# Patient Record
Sex: Female | Born: 1980 | Race: White | Hispanic: No | Marital: Single | Smoking: Current every day smoker
Health system: Southern US, Community
[De-identification: ages and names within clinical notes are randomized; demographics above are authoritative.]

## PROBLEM LIST (undated history)

## (undated) DIAGNOSIS — R319 Hematuria, unspecified: Secondary | ICD-10-CM

## (undated) DIAGNOSIS — Z87442 Personal history of urinary calculi: Secondary | ICD-10-CM

## (undated) DIAGNOSIS — N200 Calculus of kidney: Secondary | ICD-10-CM

## (undated) DIAGNOSIS — M51369 Other intervertebral disc degeneration, lumbar region without mention of lumbar back pain or lower extremity pain: Secondary | ICD-10-CM

## (undated) DIAGNOSIS — N201 Calculus of ureter: Secondary | ICD-10-CM

## (undated) DIAGNOSIS — M47816 Spondylosis without myelopathy or radiculopathy, lumbar region: Secondary | ICD-10-CM

## (undated) DIAGNOSIS — M549 Dorsalgia, unspecified: Secondary | ICD-10-CM

## (undated) DIAGNOSIS — G8929 Other chronic pain: Secondary | ICD-10-CM

## (undated) DIAGNOSIS — M5136 Other intervertebral disc degeneration, lumbar region: Secondary | ICD-10-CM

## (undated) HISTORY — PX: WISDOM TOOTH EXTRACTION: SHX21

---

## 2000-02-06 ENCOUNTER — Encounter: Payer: Self-pay | Admitting: *Deleted

## 2000-02-06 ENCOUNTER — Ambulatory Visit (HOSPITAL_COMMUNITY): Admission: RE | Admit: 2000-02-06 | Discharge: 2000-02-06 | Payer: Self-pay | Admitting: *Deleted

## 2000-07-23 ENCOUNTER — Inpatient Hospital Stay (HOSPITAL_COMMUNITY): Admission: AD | Admit: 2000-07-23 | Discharge: 2000-07-25 | Payer: Self-pay | Admitting: *Deleted

## 2000-09-03 ENCOUNTER — Inpatient Hospital Stay (HOSPITAL_COMMUNITY): Admission: AD | Admit: 2000-09-03 | Discharge: 2000-09-03 | Payer: Self-pay | Admitting: *Deleted

## 2000-12-03 ENCOUNTER — Inpatient Hospital Stay (HOSPITAL_COMMUNITY): Admission: AD | Admit: 2000-12-03 | Discharge: 2000-12-03 | Payer: Self-pay | Admitting: *Deleted

## 2001-02-25 ENCOUNTER — Inpatient Hospital Stay (HOSPITAL_COMMUNITY): Admission: AD | Admit: 2001-02-25 | Discharge: 2001-02-25 | Payer: Self-pay | Admitting: *Deleted

## 2001-05-20 ENCOUNTER — Inpatient Hospital Stay (HOSPITAL_COMMUNITY): Admission: AD | Admit: 2001-05-20 | Discharge: 2001-05-20 | Payer: Self-pay | Admitting: *Deleted

## 2002-10-02 ENCOUNTER — Inpatient Hospital Stay (HOSPITAL_COMMUNITY): Admission: AD | Admit: 2002-10-02 | Discharge: 2002-10-02 | Payer: Self-pay | Admitting: Obstetrics

## 2002-10-04 ENCOUNTER — Inpatient Hospital Stay (HOSPITAL_COMMUNITY): Admission: AD | Admit: 2002-10-04 | Discharge: 2002-10-04 | Payer: Self-pay | Admitting: Obstetrics

## 2002-10-23 ENCOUNTER — Inpatient Hospital Stay (HOSPITAL_COMMUNITY): Admission: AD | Admit: 2002-10-23 | Discharge: 2002-10-23 | Payer: Self-pay | Admitting: Obstetrics

## 2002-11-07 ENCOUNTER — Inpatient Hospital Stay (HOSPITAL_COMMUNITY): Admission: AD | Admit: 2002-11-07 | Discharge: 2002-11-09 | Payer: Self-pay | Admitting: Obstetrics

## 2003-07-29 ENCOUNTER — Emergency Department (HOSPITAL_COMMUNITY): Admission: EM | Admit: 2003-07-29 | Discharge: 2003-07-30 | Payer: Self-pay | Admitting: Emergency Medicine

## 2003-08-18 ENCOUNTER — Emergency Department (HOSPITAL_COMMUNITY): Admission: EM | Admit: 2003-08-18 | Discharge: 2003-08-19 | Payer: Self-pay | Admitting: Emergency Medicine

## 2003-10-10 ENCOUNTER — Emergency Department (HOSPITAL_COMMUNITY): Admission: EM | Admit: 2003-10-10 | Discharge: 2003-10-10 | Payer: Self-pay | Admitting: Emergency Medicine

## 2004-01-18 ENCOUNTER — Emergency Department (HOSPITAL_COMMUNITY): Admission: EM | Admit: 2004-01-18 | Discharge: 2004-01-18 | Payer: Self-pay | Admitting: Emergency Medicine

## 2004-04-14 ENCOUNTER — Emergency Department (HOSPITAL_COMMUNITY): Admission: EM | Admit: 2004-04-14 | Discharge: 2004-04-14 | Payer: Self-pay | Admitting: Emergency Medicine

## 2004-04-15 ENCOUNTER — Emergency Department (HOSPITAL_COMMUNITY): Admission: EM | Admit: 2004-04-15 | Discharge: 2004-04-15 | Payer: Self-pay | Admitting: Family Medicine

## 2004-05-15 ENCOUNTER — Emergency Department (HOSPITAL_COMMUNITY): Admission: EM | Admit: 2004-05-15 | Discharge: 2004-05-15 | Payer: Self-pay | Admitting: Emergency Medicine

## 2004-05-18 ENCOUNTER — Emergency Department (HOSPITAL_COMMUNITY): Admission: EM | Admit: 2004-05-18 | Discharge: 2004-05-18 | Payer: Self-pay | Admitting: Emergency Medicine

## 2004-08-14 ENCOUNTER — Emergency Department (HOSPITAL_COMMUNITY): Admission: EM | Admit: 2004-08-14 | Discharge: 2004-08-14 | Payer: Self-pay | Admitting: Emergency Medicine

## 2004-08-16 ENCOUNTER — Emergency Department (HOSPITAL_COMMUNITY): Admission: EM | Admit: 2004-08-16 | Discharge: 2004-08-16 | Payer: Self-pay | Admitting: Emergency Medicine

## 2004-10-14 ENCOUNTER — Emergency Department (HOSPITAL_COMMUNITY): Admission: EM | Admit: 2004-10-14 | Discharge: 2004-10-14 | Payer: Self-pay | Admitting: Emergency Medicine

## 2005-08-23 ENCOUNTER — Emergency Department (HOSPITAL_COMMUNITY): Admission: EM | Admit: 2005-08-23 | Discharge: 2005-08-23 | Payer: Self-pay | Admitting: Emergency Medicine

## 2005-10-26 ENCOUNTER — Emergency Department (HOSPITAL_COMMUNITY): Admission: EM | Admit: 2005-10-26 | Discharge: 2005-10-26 | Payer: Self-pay | Admitting: Emergency Medicine

## 2006-02-16 ENCOUNTER — Emergency Department (HOSPITAL_COMMUNITY): Admission: EM | Admit: 2006-02-16 | Discharge: 2006-02-17 | Payer: Self-pay | Admitting: Emergency Medicine

## 2006-04-02 ENCOUNTER — Emergency Department (HOSPITAL_COMMUNITY): Admission: EM | Admit: 2006-04-02 | Discharge: 2006-04-02 | Payer: Self-pay | Admitting: Emergency Medicine

## 2006-05-06 ENCOUNTER — Emergency Department (HOSPITAL_COMMUNITY): Admission: EM | Admit: 2006-05-06 | Discharge: 2006-05-06 | Payer: Self-pay | Admitting: Emergency Medicine

## 2006-12-14 ENCOUNTER — Emergency Department (HOSPITAL_COMMUNITY): Admission: EM | Admit: 2006-12-14 | Discharge: 2006-12-14 | Payer: Self-pay | Admitting: Emergency Medicine

## 2007-11-05 ENCOUNTER — Emergency Department (HOSPITAL_COMMUNITY): Admission: EM | Admit: 2007-11-05 | Discharge: 2007-11-05 | Payer: Self-pay | Admitting: Emergency Medicine

## 2007-11-16 ENCOUNTER — Ambulatory Visit (HOSPITAL_COMMUNITY): Admission: RE | Admit: 2007-11-16 | Discharge: 2007-11-16 | Payer: Self-pay | Admitting: Orthopedic Surgery

## 2007-12-03 ENCOUNTER — Emergency Department (HOSPITAL_COMMUNITY): Admission: EM | Admit: 2007-12-03 | Discharge: 2007-12-03 | Payer: Self-pay | Admitting: Family Medicine

## 2008-02-20 ENCOUNTER — Emergency Department (HOSPITAL_COMMUNITY): Admission: EM | Admit: 2008-02-20 | Discharge: 2008-02-21 | Payer: Self-pay | Admitting: Emergency Medicine

## 2008-10-11 ENCOUNTER — Emergency Department (HOSPITAL_COMMUNITY): Admission: EM | Admit: 2008-10-11 | Discharge: 2008-10-11 | Payer: Self-pay | Admitting: Emergency Medicine

## 2010-01-14 ENCOUNTER — Emergency Department (HOSPITAL_COMMUNITY): Admission: EM | Admit: 2010-01-14 | Discharge: 2010-01-14 | Payer: Self-pay | Admitting: Emergency Medicine

## 2010-03-12 ENCOUNTER — Emergency Department (HOSPITAL_COMMUNITY): Admission: EM | Admit: 2010-03-12 | Discharge: 2010-03-12 | Payer: Self-pay | Admitting: Emergency Medicine

## 2010-03-28 ENCOUNTER — Emergency Department (HOSPITAL_COMMUNITY): Admission: EM | Admit: 2010-03-28 | Discharge: 2010-03-28 | Payer: Self-pay | Admitting: Emergency Medicine

## 2010-07-22 ENCOUNTER — Emergency Department (HOSPITAL_COMMUNITY): Admission: EM | Admit: 2010-07-22 | Discharge: 2010-07-22 | Payer: Self-pay | Admitting: Family Medicine

## 2010-09-13 ENCOUNTER — Emergency Department (HOSPITAL_COMMUNITY): Admission: EM | Admit: 2010-09-13 | Discharge: 2010-09-14 | Payer: Self-pay | Admitting: Emergency Medicine

## 2011-01-01 ENCOUNTER — Inpatient Hospital Stay (HOSPITAL_COMMUNITY)
Admission: AD | Admit: 2011-01-01 | Discharge: 2011-01-01 | Disposition: A | Discharge status: Home / Self Care | Payer: Self-pay | Source: Ambulatory Visit | Attending: Obstetrics and Gynecology | Admitting: Obstetrics and Gynecology

## 2011-01-01 ENCOUNTER — Inpatient Hospital Stay (HOSPITAL_COMMUNITY): Payer: Self-pay

## 2011-01-01 DIAGNOSIS — R1031 Right lower quadrant pain: Secondary | ICD-10-CM | POA: Insufficient documentation

## 2011-01-01 DIAGNOSIS — A499 Bacterial infection, unspecified: Secondary | ICD-10-CM | POA: Insufficient documentation

## 2011-01-01 DIAGNOSIS — N760 Acute vaginitis: Secondary | ICD-10-CM

## 2011-01-01 DIAGNOSIS — N83209 Unspecified ovarian cyst, unspecified side: Secondary | ICD-10-CM | POA: Insufficient documentation

## 2011-01-01 DIAGNOSIS — B9689 Other specified bacterial agents as the cause of diseases classified elsewhere: Secondary | ICD-10-CM | POA: Insufficient documentation

## 2011-01-01 LAB — URINALYSIS, ROUTINE W REFLEX MICROSCOPIC
Bilirubin Urine: NEGATIVE
Ketones, ur: NEGATIVE mg/dL
Protein, ur: NEGATIVE mg/dL
Urine Glucose, Fasting: NEGATIVE mg/dL
Urobilinogen, UA: 1 mg/dL (ref 0.0–1.0)

## 2011-01-01 LAB — WET PREP, GENITAL
Trich, Wet Prep: NONE SEEN
Yeast Wet Prep HPF POC: NONE SEEN

## 2011-01-01 LAB — URINE MICROSCOPIC-ADD ON

## 2011-01-01 LAB — DIFFERENTIAL
Basophils Relative: 1 % (ref 0–1)
Eosinophils Absolute: 0.1 10*3/uL (ref 0.0–0.7)
Lymphs Abs: 2.8 10*3/uL (ref 0.7–4.0)
Monocytes Relative: 8 % (ref 3–12)

## 2011-01-01 LAB — CBC
HCT: 36 % (ref 36.0–46.0)
Hemoglobin: 12.7 g/dL (ref 12.0–15.0)
MCHC: 35.3 g/dL (ref 30.0–36.0)
Platelets: 201 10*3/uL (ref 150–400)
RDW: 12.2 % (ref 11.5–15.5)

## 2011-02-08 LAB — PREGNANCY, URINE: Preg Test, Ur: NEGATIVE

## 2011-02-08 LAB — URINALYSIS, ROUTINE W REFLEX MICROSCOPIC
Bilirubin Urine: NEGATIVE
Glucose, UA: NEGATIVE mg/dL
Hgb urine dipstick: NEGATIVE
Ketones, ur: NEGATIVE mg/dL
Nitrite: NEGATIVE
Specific Gravity, Urine: 1.009 (ref 1.005–1.030)

## 2011-06-20 ENCOUNTER — Emergency Department (HOSPITAL_COMMUNITY): Payer: Self-pay

## 2011-06-20 ENCOUNTER — Emergency Department (HOSPITAL_COMMUNITY)
Admission: EM | Admit: 2011-06-20 | Discharge: 2011-06-20 | Disposition: A | Discharge status: Home / Self Care | Payer: Self-pay | Attending: Emergency Medicine | Admitting: Emergency Medicine

## 2011-06-20 DIAGNOSIS — W1809XA Striking against other object with subsequent fall, initial encounter: Secondary | ICD-10-CM | POA: Insufficient documentation

## 2011-06-20 DIAGNOSIS — S90129A Contusion of unspecified lesser toe(s) without damage to nail, initial encounter: Secondary | ICD-10-CM | POA: Insufficient documentation

## 2011-06-20 DIAGNOSIS — Y92009 Unspecified place in unspecified non-institutional (private) residence as the place of occurrence of the external cause: Secondary | ICD-10-CM | POA: Insufficient documentation

## 2011-08-21 LAB — BASIC METABOLIC PANEL
BUN: 12
Chloride: 106
GFR calc non Af Amer: >60
Potassium: 3.6

## 2011-08-21 LAB — DIFFERENTIAL
Basophils Relative: 1
Eosinophils Relative: 1
Lymphocytes Relative: 51 — ABNORMAL HIGH
Monocytes Absolute: 0.5
Neutrophils Relative %: 39 — ABNORMAL LOW

## 2011-08-21 LAB — POCT PREGNANCY, URINE: Preg Test, Ur: NEGATIVE

## 2011-08-21 LAB — CBC
HCT: 34.3 — ABNORMAL LOW
Hemoglobin: 11.9 — ABNORMAL LOW
MCV: 90.2
Platelets: 189
RBC: 3.8 — ABNORMAL LOW

## 2011-08-21 LAB — URINALYSIS, ROUTINE W REFLEX MICROSCOPIC
Bilirubin Urine: NEGATIVE
Glucose, UA: NEGATIVE
Hgb urine dipstick: NEGATIVE
Ketones, ur: NEGATIVE
Specific Gravity, Urine: 1.009
Urobilinogen, UA: 1

## 2011-08-29 LAB — URINALYSIS, ROUTINE W REFLEX MICROSCOPIC
Glucose, UA: NEGATIVE
Protein, ur: 30 — AB
Urobilinogen, UA: 0.2

## 2011-08-29 LAB — URINE MICROSCOPIC-ADD ON

## 2011-08-29 LAB — POCT PREGNANCY, URINE: Preg Test, Ur: NEGATIVE

## 2012-10-18 ENCOUNTER — Inpatient Hospital Stay (HOSPITAL_COMMUNITY)
Admission: AD | Admit: 2012-10-18 | Discharge: 2012-10-19 | Disposition: A | Discharge status: Home / Self Care | Payer: Medicaid Other | Source: Ambulatory Visit | Attending: Obstetrics & Gynecology | Admitting: Obstetrics & Gynecology

## 2012-10-18 ENCOUNTER — Inpatient Hospital Stay (HOSPITAL_COMMUNITY): Payer: Medicaid Other

## 2012-10-18 ENCOUNTER — Encounter (HOSPITAL_COMMUNITY): Payer: Self-pay | Admitting: *Deleted

## 2012-10-18 DIAGNOSIS — N390 Urinary tract infection, site not specified: Secondary | ICD-10-CM

## 2012-10-18 DIAGNOSIS — R197 Diarrhea, unspecified: Secondary | ICD-10-CM | POA: Insufficient documentation

## 2012-10-18 DIAGNOSIS — R109 Unspecified abdominal pain: Secondary | ICD-10-CM

## 2012-10-18 LAB — WET PREP, GENITAL: Trich, Wet Prep: NONE SEEN

## 2012-10-18 LAB — URINALYSIS, ROUTINE W REFLEX MICROSCOPIC
Glucose, UA: NEGATIVE mg/dL
Specific Gravity, Urine: <1.005 — ABNORMAL LOW (ref 1.005–1.030)
Urobilinogen, UA: 0.2 mg/dL (ref 0.0–1.0)
pH: 6 (ref 5.0–8.0)

## 2012-10-18 LAB — URINE MICROSCOPIC-ADD ON

## 2012-10-18 LAB — POCT PREGNANCY, URINE: Preg Test, Ur: NEGATIVE

## 2012-10-18 NOTE — MAU Provider Note (Signed)
History     CSN: [id number]  Arrival date and time: 10/18/12 2016   None     Chief Complaint  Patient presents with  . Abdominal Pain   HPI This is a 31 y.o. female who presents with a Two day history of lower abdominal pain. Had diarrhea today. Nausea all week. No fever. Just finished period. No fever. Has a history of some dysuria this week. Has had stones in past.   OB History    Grav Para Term Preterm Abortions TAB SAB Ect Mult Living   2 2 2  0 0 0 0 0 0 2      Past Medical History  Diagnosis Date  . Migraines     History reviewed. No pertinent past surgical history.  Family History  Problem Relation Age of Onset  . Other Neg Hx     History  Substance Use Topics  . Smoking status: Heavy Tobacco Smoker  . Smokeless tobacco: Not on file  . Alcohol Use: No    Allergies: No Known Allergies  Prescriptions prior to admission  Medication Sig Dispense Refill  . aspirin-acetaminophen-caffeine (EXCEDRIN MIGRAINE) 250-250-65 MG per tablet Take 2 tablets by mouth every 6 (six) hours as needed.      . Cranberry-Vit C-Lactobacillus (RA CRANBERRY SUPPLEMENTS PO) Take by mouth.      Marland Kitchen ibuprofen (ADVIL,MOTRIN) 400 MG tablet Take 400 mg by mouth every 6 (six) hours as needed.      . Multiple Vitamins-Minerals (MULTIVITAMIN WITH MINERALS) tablet Take 1 tablet by mouth daily.        ROS See HPI  Physical Exam   Blood pressure 122/78, pulse 83, temperature 98.2 F (36.8 C), temperature source Oral, resp. rate 20, height 5' (1.524 m), weight 105 lb 3.2 oz (47.718 kg), last menstrual period 10/15/2012.  Physical Exam  Constitutional: She is oriented to person, place, and time. She appears well-developed and well-nourished. No distress.  Cardiovascular: Normal rate.   Respiratory: Effort normal.  GI: Soft. She exhibits no distension and no mass. There is tenderness (diffuse lower abdominal). There is no rebound and no guarding.  Genitourinary: Vagina normal and uterus  normal. No vaginal discharge found.  Musculoskeletal: Normal range of motion.  Neurological: She is alert and oriented to person, place, and time.  Skin: Skin is warm and dry.  Psychiatric: She has a normal mood and affect.   Results for orders placed during the hospital encounter of 10/18/12 (from the past 24 hour(s))  URINALYSIS, ROUTINE W REFLEX MICROSCOPIC     Status: Abnormal   Collection Time   10/18/12  8:31 PM      Component Value Range   Color, Urine YELLOW  YELLOW   APPearance CLEAR  CLEAR   Specific Gravity, Urine <1.005 (*) 1.005 - 1.030   pH 6.0  5.0 - 8.0   Glucose, UA NEGATIVE  NEGATIVE mg/dL   Hgb urine dipstick LARGE (*) NEGATIVE   Bilirubin Urine NEGATIVE  NEGATIVE   Ketones, ur NEGATIVE  NEGATIVE mg/dL   Protein, ur NEGATIVE  NEGATIVE mg/dL   Urobilinogen, UA 0.2  0.0 - 1.0 mg/dL   Nitrite NEGATIVE  NEGATIVE   Leukocytes, UA TRACE (*) NEGATIVE  URINE MICROSCOPIC-ADD ON     Status: Normal   Collection Time   10/18/12  8:31 PM      Component Value Range   Squamous Epithelial / LPF RARE  RARE   WBC, UA 7-10  <3 WBC/hpf   RBC / HPF 0-2  <  3 RBC/hpf   Bacteria, UA RARE  RARE  POCT PREGNANCY, URINE     Status: Normal   Collection Time   10/18/12  8:39 PM      Component Value Range   Preg Test, Ur NEGATIVE  NEGATIVE  WET PREP, GENITAL     Status: Abnormal   Collection Time   10/18/12 10:45 PM      Component Value Range   Yeast Wet Prep HPF POC NONE SEEN  NONE SEEN   Trich, Wet Prep NONE SEEN  NONE SEEN   Clue Cells Wet Prep HPF POC FEW (*) NONE SEEN   WBC, Wet Prep HPF POC FEW (*) NONE SEEN  CBC     Status: Abnormal   Collection Time   10/18/12 11:47 PM      Component Value Range   WBC 6.9  4.0 - 10.5 K/uL   RBC 3.81 (*) 3.87 - 5.11 MIL/uL   Hemoglobin 11.9 (*) 12.0 - 15.0 g/dL   HCT 16.1 (*) 09.6 - 04.5 %   MCV 92.7  78.0 - 100.0 fL   MCH 31.2  26.0 - 34.0 pg   MCHC 33.7  30.0 - 36.0 g/dL   RDW 40.9  81.1 - 91.4 %   Platelets 231  150 - 400 K/uL    US Pelvis Complete  10/18/2012  *RADIOLOGY REPORT*  Clinical Data: Pelvic pain for 2 days.  TRANSABDOMINAL AND TRANSVAGINAL ULTRASOUND OF PELVIS Technique:  Both transabdominal and transvaginal ultrasound examinations of the pelvis were performed. Transabdominal technique was performed for global imaging of the pelvis including uterus, ovaries, adnexal regions, and pelvic cul-de-sac.  It was necessary to proceed with endovaginal exam following the transabdominal exam to visualize the uterus and ovaries.  Comparison:  01/01/2011  Findings:  Uterus: The uterus measures 7.3 x 3.6 x 4.5 cm.  Normal homogeneous myometrial echotexture.  No focal mass lesions demonstrated. Cervix is unremarkable.  Endometrium: Endometrial stripe thickness and appearance is normal. Stripe thickness measures 4.6 mm.  Right ovary:  Right ovary measures 3.1 x 2.1 x 2.7 cm.  Normal follicular changes are demonstrated.  Flow is demonstrated in the right ovary on color flow Doppler imaging.  Left ovary: Left ovary measures 3.1 x 2.3 x 2.6 cm.  Normal follicular changes are demonstrated.  Flow is demonstrated in the left ovary on color flow Doppler imaging.  Other findings: Moderate free pelvic fluid is demonstrated.  IMPRESSION: Moderate free fluid in the pelvis.  Uterus and ovaries are unremarkable.   Original Report Authenticated By: Burman Nieves, M.D.    MAU Course  Procedures  Assessment and Plan  A:  Abdominal pain, normal WBC and pelvic US      Possible UTI      Possible gastroenteritis, given the diarrhea  P:  Rx Septra x 3 days       Urine to culture       Followup PRN  [facility] 10/18/2012, 10:49 PM

## 2012-10-18 NOTE — MAU Note (Signed)
About  A wk ago I couldn't empty my bladder. Started taking cranberry pills and now I can pee ok. I've had kidney stones before. No appetite for a wk.

## 2012-10-18 NOTE — MAU Note (Signed)
I've been under a lot of stress. Been having a lot of sharp stomach pains. My period was a day late and only lasted 3 days. Started Tues.

## 2012-10-19 DIAGNOSIS — N390 Urinary tract infection, site not specified: Secondary | ICD-10-CM

## 2012-10-19 LAB — CBC
HCT: 35.3 % — ABNORMAL LOW (ref 36.0–46.0)
Platelets: 231 10*3/uL (ref 150–400)
RBC: 3.81 MIL/uL — ABNORMAL LOW (ref 3.87–5.11)
RDW: 12.8 % (ref 11.5–15.5)
WBC: 6.9 10*3/uL (ref 4.0–10.5)

## 2012-10-19 LAB — GC/CHLAMYDIA PROBE AMP, GENITAL: Chlamydia, DNA Probe: NEGATIVE

## 2012-10-19 MED ORDER — SULFAMETHOXAZOLE-TRIMETHOPRIM 800-160 MG PO TABS: 1.0000 | ORAL_TABLET | Freq: Two times a day (BID) | ORAL | Status: DC

## 2012-10-19 NOTE — Progress Notes (Signed)
Megan Frazier CNM in earlier and d/c plan discussed. Written and verbal d/c instructions given and understanding voiced.

## 2012-10-21 LAB — URINE CULTURE: Colony Count: 60000

## 2012-10-21 NOTE — MAU Provider Note (Signed)
Attestation of Attending Supervision of Advanced Practitioner (CNM/NP): Evaluation and management procedures were performed by the Advanced Practitioner under my supervision and collaboration.  I have reviewed the Advanced Practitioner's note and chart, and I agree with the management and plan.  Alistair Senft, MD, FACOG Attending Obstetrician & Gynecologist Faculty Practice, [facility]  

## 2012-11-26 ENCOUNTER — Encounter (HOSPITAL_COMMUNITY): Payer: Self-pay | Admitting: *Deleted

## 2012-11-26 ENCOUNTER — Inpatient Hospital Stay (HOSPITAL_COMMUNITY)
Admission: AD | Admit: 2012-11-26 | Discharge: 2012-11-26 | Disposition: A | Discharge status: Home / Self Care | Payer: Medicaid Other | Source: Ambulatory Visit | Attending: Obstetrics & Gynecology | Admitting: Obstetrics & Gynecology

## 2012-11-26 DIAGNOSIS — R358 Other polyuria: Secondary | ICD-10-CM | POA: Insufficient documentation

## 2012-11-26 DIAGNOSIS — R109 Unspecified abdominal pain: Secondary | ICD-10-CM | POA: Insufficient documentation

## 2012-11-26 DIAGNOSIS — N644 Mastodynia: Secondary | ICD-10-CM | POA: Insufficient documentation

## 2012-11-26 DIAGNOSIS — R140 Abdominal distension (gaseous): Secondary | ICD-10-CM

## 2012-11-26 DIAGNOSIS — R142 Eructation: Secondary | ICD-10-CM | POA: Insufficient documentation

## 2012-11-26 DIAGNOSIS — R141 Gas pain: Secondary | ICD-10-CM | POA: Insufficient documentation

## 2012-11-26 DIAGNOSIS — N912 Amenorrhea, unspecified: Secondary | ICD-10-CM | POA: Insufficient documentation

## 2012-11-26 DIAGNOSIS — N911 Secondary amenorrhea: Secondary | ICD-10-CM

## 2012-11-26 DIAGNOSIS — R3589 Other polyuria: Secondary | ICD-10-CM | POA: Insufficient documentation

## 2012-11-26 DIAGNOSIS — R112 Nausea with vomiting, unspecified: Secondary | ICD-10-CM

## 2012-11-26 DIAGNOSIS — R143 Flatulence: Secondary | ICD-10-CM

## 2012-11-26 LAB — URINALYSIS, ROUTINE W REFLEX MICROSCOPIC
Bilirubin Urine: NEGATIVE
Glucose, UA: NEGATIVE mg/dL
Ketones, ur: NEGATIVE mg/dL
pH: 8 (ref 5.0–8.0)

## 2012-11-26 LAB — COMPREHENSIVE METABOLIC PANEL
CO2: 28 mEq/L (ref 19–32)
Calcium: 11 mg/dL — ABNORMAL HIGH (ref 8.4–10.5)
Creatinine, Ser: 0.84 mg/dL (ref 0.50–1.10)
GFR calc Af Amer: >90 mL/min (ref >90)
GFR calc non Af Amer: >90 mL/min (ref >90)
Glucose, Bld: 87 mg/dL (ref 70–99)

## 2012-11-26 LAB — CBC
Hemoglobin: 14 g/dL (ref 12.0–15.0)
MCH: 31.3 pg (ref 26.0–34.0)
MCV: 94.4 fL (ref 78.0–100.0)
RBC: 4.48 MIL/uL (ref 3.87–5.11)

## 2012-11-26 LAB — PROLACTIN: Prolactin: 3.9 ng/mL

## 2012-11-26 LAB — TSH: TSH: 1.061 u[IU]/mL (ref 0.350–4.500)

## 2012-11-26 MED ORDER — DOCUSATE SODIUM 100 MG PO CAPS: 100.0000 mg | ORAL_CAPSULE | Freq: Every day | ORAL | Status: DC | PRN

## 2012-11-26 NOTE — MAU Note (Signed)
Patient states she has has a 17 pound weight loss since the end of October early November. Has had nausea with occasional vomiting for about 6 weeks. Feels abdominal pain on and off and abdominal bloating.Denies any bleeding or abnormal discharge. Has had negative pregnancy tests at home. Feel like breasts are bigger and are tender.

## 2012-11-26 NOTE — MAU Provider Note (Signed)
Chief Complaint: Possible Pregnancy, Weight Loss and Abdominal Pain  First Provider Initiated Contact with Patient 11/26/12 1441      SUBJECTIVE HPI: Megan Frazier is a 31 y.o. [id number] who presents with: 1. Unexplained 17 pound weight loss over 2 months.  2. Nausea with occasional vomiting for about 6 weeks.  3. Mild, diffuse abdominal pain and bloating. 4. No period since 09/22/12. Normally has monthly cycles. Neg home UPTs. 5. Polyuria w/out dysuria, urgency, hematuria. 6. Breasts fullness and tenderness.   Denies vaginal bleeding, vaginal discharge, flank pain, polydipsia, fever, chills, nipple discharge, RUQ pain, diarrhea, constipation, medication change, recent stressful event. Pelvic US 11/13 normal uterus and ovaries. Mod free fluid. Has not seen PCP.    Past Medical History  Diagnosis Date  . Migraines    OB History    Grav Para Term Preterm Abortions TAB SAB Ect Mult Living   2 2 2  0 0 0 0 0 0 2     # Outc Date GA Lbr Len/2nd Wgt Sex Del Anes PTL Lv   1 TRM      SVD      2 TRM      SVD        Past Surgical History  Procedure Date  . Wisdom tooth extraction    History   Social History  . Marital Status: Single    Spouse Name: N/A    Number of Children: N/A  . Years of Education: N/A   Occupational History  . Not on file.   Social History Main Topics  . Smoking status: Heavy Tobacco Smoker -- 1.0 packs/day    Types: Cigarettes  . Smokeless tobacco: Never Used  . Alcohol Use: Yes     Comment: Occas.  . Drug Use: No  . Sexually Active: Yes    Birth Control/ Protection: None   Other Topics Concern  . Not on file   Social History Narrative  . No narrative on file   No current facility-administered medications on file prior to encounter.   Current Outpatient Prescriptions on File Prior to Encounter  Medication Sig Dispense Refill  . aspirin-acetaminophen-caffeine (EXCEDRIN MIGRAINE) 250-250-65 MG per tablet Take 2 tablets by mouth daily as needed.  For headache      . Cranberry-Vit C-Lactobacillus (RA CRANBERRY SUPPLEMENTS PO) Take 1 tablet by mouth daily.       . Multiple Vitamins-Minerals (MULTIVITAMIN WITH MINERALS) tablet Take 1 tablet by mouth daily.       No Known Allergies  ROS: Pertinent items in HPI  OBJECTIVE Blood pressure 125/83, pulse 76, temperature 98.3 F (36.8 C), temperature source Oral, resp. rate 16, height 5\' 1"  (1.549 m), weight 46.902 kg (103 lb 6.4 oz), last menstrual period 09/22/2012, SpO2 100.00%. GENERAL: Well-developed, adequately-nourished, but slender female in no acute distress.  HEENT: Normocephalic HEART: normal rate RESP: normal effort ABDOMEN: Soft, non-tender. Mildly distended. Pos BS.  EXTREMITIES: Nontender, no edema NEURO: Alert and oriented SPECULUM EXAM: deferred  LAB RESULTS Results for orders placed during the hospital encounter of 11/26/12 (from the past 24 hour(s))  URINALYSIS, ROUTINE W REFLEX MICROSCOPIC     Status: Normal   Collection Time   11/26/12 12:35 PM      Component Value Range   Color, Urine YELLOW  YELLOW   APPearance CLEAR  CLEAR   Specific Gravity, Urine 1.010  1.005 - 1.030   pH 8.0  5.0 - 8.0   Glucose, UA NEGATIVE  NEGATIVE mg/dL   Hgb  urine dipstick NEGATIVE  NEGATIVE   Bilirubin Urine NEGATIVE  NEGATIVE   Ketones, ur NEGATIVE  NEGATIVE mg/dL   Protein, ur NEGATIVE  NEGATIVE mg/dL   Urobilinogen, UA 0.2  0.0 - 1.0 mg/dL   Nitrite NEGATIVE  NEGATIVE   Leukocytes, UA NEGATIVE  NEGATIVE  POCT PREGNANCY, URINE     Status: Normal   Collection Time   11/26/12 12:48 PM      Component Value Range   Preg Test, Ur NEGATIVE  NEGATIVE  CBC     Status: Normal   Collection Time   11/26/12  2:50 PM      Component Value Range   WBC 8.5  4.0 - 10.5 K/uL   RBC 4.48  3.87 - 5.11 MIL/uL   Hemoglobin 14.0  12.0 - 15.0 g/dL   HCT 40.9  81.1 - 91.4 %   MCV 94.4  78.0 - 100.0 fL   MCH 31.3  26.0 - 34.0 pg   MCHC 33.1  30.0 - 36.0 g/dL   RDW 78.2  95.6 - 21.3 %    Platelets 206  150 - 400 K/uL  COMPREHENSIVE METABOLIC PANEL     Status: Abnormal   Collection Time   11/26/12  2:50 PM      Component Value Range   Sodium 137  135 - 145 mEq/L   Potassium 5.4 (*) 3.5 - 5.1 mEq/L   Chloride 100  96 - 112 mEq/L   CO2 28  19 - 32 mEq/L   Glucose, Bld 87  70 - 99 mg/dL   BUN 12  6 - 23 mg/dL   Creatinine, Ser 0.86  0.50 - 1.10 mg/dL   Calcium 57.8 (*) 8.4 - 10.5 mg/dL   Total Protein 7.2  6.0 - 8.3 g/dL   Albumin 4.2  3.5 - 5.2 g/dL   AST 17  0 - 37 U/L   ALT 16  0 - 35 U/L   Alkaline Phosphatase 61  39 - 117 U/L   Total Bilirubin 0.3  0.3 - 1.2 mg/dL   GFR calc non Af Amer >90  >90 mL/min   GFR calc Af Amer >90  >90 mL/min  HCG, SERUM, QUALITATIVE     Status: Normal   Collection Time   11/26/12  2:50 PM      Component Value Range   Preg, Serum NEGATIVE  NEGATIVE    IMAGING No results found.  MAU COURSE Discussed findings w/ Dr. Debroah Loop. No other eval indicated in MAU, but definitely needs office F/U.   ASSESSMENT 1. Abdominal bloating   2. Secondary amenorrhea   3. Breast tenderness in female    PLAN Discharge home TSH and Prolactin drawn. Hollenback try stool softener and Gas-X.      Follow-up Information    Schedule an appointment as soon as possible for a visit with [facility].   Contact information:   [address] [place] [address] [phone]       Follow up with [facility]. (Will call you to scheduled appointment)    Contact information:   [address] [place] [address] [phone]          Medication List     As of 11/26/2012  9:19 PM    TAKE these medications         aspirin-acetaminophen-caffeine 250-250-65 MG per tablet   Commonly known as: EXCEDRIN MIGRAINE   Take 2 tablets by mouth daily as needed. For headache  docusate sodium 100 MG capsule   Commonly known as: COLACE   Take 1 capsule (100 mg total) by mouth daily as needed for constipation.       ibuprofen 200 MG tablet   Commonly known as: ADVIL,MOTRIN   Take 400 mg by mouth every 6 (six) hours as needed. For headache/pain      multivitamin with minerals tablet   Take 1 tablet by mouth daily.      RA CRANBERRY SUPPLEMENTS PO   Take 1 tablet by mouth daily.         [place], CNM 11/26/2012  4:07 PM

## 2012-11-29 ENCOUNTER — Encounter: Payer: Self-pay | Admitting: Medical

## 2012-12-13 ENCOUNTER — Encounter: Payer: Medicaid Other | Admitting: Medical

## 2013-01-11 ENCOUNTER — Other Ambulatory Visit: Payer: Self-pay

## 2013-01-31 ENCOUNTER — Emergency Department (HOSPITAL_COMMUNITY)
Admission: EM | Admit: 2013-01-31 | Discharge: 2013-02-01 | Disposition: A | Discharge status: Home / Self Care | Payer: Medicaid Other | Attending: Emergency Medicine | Admitting: Emergency Medicine

## 2013-01-31 DIAGNOSIS — Y9389 Activity, other specified: Secondary | ICD-10-CM | POA: Insufficient documentation

## 2013-01-31 DIAGNOSIS — S0993XA Unspecified injury of face, initial encounter: Secondary | ICD-10-CM | POA: Insufficient documentation

## 2013-01-31 DIAGNOSIS — M7918 Myalgia, other site: Secondary | ICD-10-CM

## 2013-01-31 DIAGNOSIS — Z8679 Personal history of other diseases of the circulatory system: Secondary | ICD-10-CM | POA: Insufficient documentation

## 2013-01-31 DIAGNOSIS — F17200 Nicotine dependence, unspecified, uncomplicated: Secondary | ICD-10-CM | POA: Insufficient documentation

## 2013-01-31 DIAGNOSIS — Z79899 Other long term (current) drug therapy: Secondary | ICD-10-CM | POA: Insufficient documentation

## 2013-01-31 DIAGNOSIS — IMO0002 Reserved for concepts with insufficient information to code with codable children: Secondary | ICD-10-CM | POA: Insufficient documentation

## 2013-01-31 DIAGNOSIS — Y92410 Unspecified street and highway as the place of occurrence of the external cause: Secondary | ICD-10-CM | POA: Insufficient documentation
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
## 2013-02-01 MED ORDER — OXYCODONE-ACETAMINOPHEN 5-325 MG PO TABS
2.0000 | ORAL_TABLET | Freq: Once | ORAL | Status: AC
  Administered 2013-02-01: 2 via ORAL
  Filled 2013-02-01: qty 2

## 2013-02-01 MED ORDER — OXYCODONE-ACETAMINOPHEN 5-325 MG PO TABS: ORAL_TABLET | ORAL | Status: DC

## 2013-02-01 MED ORDER — DIAZEPAM 5 MG PO TABS: 5.0000 mg | ORAL_TABLET | Freq: Four times a day (QID) | ORAL | Status: DC | PRN

## 2013-02-02 NOTE — ED Provider Notes (Signed)
History/physical exam/procedure(s) were performed by non-physician practitioner and as supervising physician I was immediately available for consultation/collaboration. I have reviewed all notes and am in agreement with care and plan.   Hilario Quarry, MD 02/02/13 2330
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
## 2013-02-22 ENCOUNTER — Encounter (HOSPITAL_COMMUNITY): Payer: Self-pay | Admitting: Nurse Practitioner

## 2013-02-22 ENCOUNTER — Emergency Department (HOSPITAL_COMMUNITY)
Admission: EM | Admit: 2013-02-22 | Discharge: 2013-02-22 | Disposition: A | Discharge status: Home / Self Care | Payer: Medicaid Other | Attending: Urology | Admitting: Urology

## 2013-02-22 ENCOUNTER — Emergency Department (HOSPITAL_COMMUNITY): Payer: Medicaid Other | Admitting: Registered Nurse

## 2013-02-22 ENCOUNTER — Emergency Department (HOSPITAL_COMMUNITY): Payer: Medicaid Other

## 2013-02-22 ENCOUNTER — Encounter (HOSPITAL_COMMUNITY)
Admission: EM | Disposition: A | Discharge status: Home / Self Care | Payer: Self-pay | Source: Home / Self Care | Attending: Emergency Medicine

## 2013-02-22 ENCOUNTER — Encounter (HOSPITAL_COMMUNITY): Payer: Self-pay | Admitting: Registered Nurse

## 2013-02-22 DIAGNOSIS — R1031 Right lower quadrant pain: Secondary | ICD-10-CM | POA: Insufficient documentation

## 2013-02-22 DIAGNOSIS — N83209 Unspecified ovarian cyst, unspecified side: Secondary | ICD-10-CM | POA: Insufficient documentation

## 2013-02-22 DIAGNOSIS — N390 Urinary tract infection, site not specified: Secondary | ICD-10-CM | POA: Insufficient documentation

## 2013-02-22 DIAGNOSIS — R109 Unspecified abdominal pain: Secondary | ICD-10-CM | POA: Insufficient documentation

## 2013-02-22 DIAGNOSIS — N854 Malposition of uterus: Secondary | ICD-10-CM | POA: Insufficient documentation

## 2013-02-22 DIAGNOSIS — Q762 Congenital spondylolisthesis: Secondary | ICD-10-CM | POA: Insufficient documentation

## 2013-02-22 DIAGNOSIS — N23 Unspecified renal colic: Secondary | ICD-10-CM

## 2013-02-22 DIAGNOSIS — R110 Nausea: Secondary | ICD-10-CM | POA: Insufficient documentation

## 2013-02-22 DIAGNOSIS — I998 Other disorder of circulatory system: Secondary | ICD-10-CM | POA: Insufficient documentation

## 2013-02-22 DIAGNOSIS — N1330 Unspecified hydronephrosis: Secondary | ICD-10-CM | POA: Insufficient documentation

## 2013-02-22 DIAGNOSIS — N201 Calculus of ureter: Secondary | ICD-10-CM | POA: Insufficient documentation

## 2013-02-22 HISTORY — DX: Other intervertebral disc degeneration, lumbar region: M51.36

## 2013-02-22 HISTORY — PX: CYSTOSCOPY W/ URETERAL STENT PLACEMENT: SHX1429

## 2013-02-22 HISTORY — DX: Other intervertebral disc degeneration, lumbar region without mention of lumbar back pain or lower extremity pain: M51.369

## 2013-02-22 HISTORY — PX: HOLMIUM LASER APPLICATION: SHX5852

## 2013-02-22 LAB — CBC WITH DIFFERENTIAL/PLATELET
Hemoglobin: 14.5 g/dL (ref 12.0–15.0)
Lymphocytes Relative: 9 % — ABNORMAL LOW (ref 12–46)
Lymphs Abs: 1.1 10*3/uL (ref 0.7–4.0)
MCV: 91.7 fL (ref 78.0–100.0)
Monocytes Relative: 2 % — ABNORMAL LOW (ref 3–12)
Neutrophils Relative %: 89 % — ABNORMAL HIGH (ref 43–77)
Platelets: 277 10*3/uL (ref 150–400)
RBC: 4.46 MIL/uL (ref 3.87–5.11)
WBC: 12.8 10*3/uL — ABNORMAL HIGH (ref 4.0–10.5)

## 2013-02-22 LAB — COMPREHENSIVE METABOLIC PANEL
ALT: 14 U/L (ref 0–35)
Alkaline Phosphatase: 77 U/L (ref 39–117)
BUN: 10 mg/dL (ref 6–23)
CO2: 23 mEq/L (ref 19–32)
GFR calc Af Amer: >90 mL/min (ref >90)
GFR calc non Af Amer: >90 mL/min (ref >90)
Glucose, Bld: 95 mg/dL (ref 70–99)
Potassium: 4.6 mEq/L (ref 3.5–5.1)
Sodium: 135 mEq/L (ref 135–145)
Total Bilirubin: 0.7 mg/dL (ref 0.3–1.2)

## 2013-02-22 LAB — URINALYSIS, MICROSCOPIC ONLY
Bilirubin Urine: NEGATIVE
Ketones, ur: 15 mg/dL — AB
Protein, ur: 100 mg/dL — AB
Specific Gravity, Urine: 1.016 (ref 1.005–1.030)
Urobilinogen, UA: 1 mg/dL (ref 0.0–1.0)

## 2013-02-22 LAB — POCT PREGNANCY, URINE: Preg Test, Ur: NEGATIVE

## 2013-02-22 SURGERY — CYSTOSCOPY, WITH RETROGRADE PYELOGRAM AND URETERAL STENT INSERTION
Anesthesia: General | Laterality: Right | Wound class: Clean Contaminated

## 2013-02-22 MED ORDER — FENTANYL CITRATE 0.05 MG/ML IJ SOLN
50.0000 ug | INTRAMUSCULAR | Status: AC | PRN
  Administered 2013-02-22 (×2): 50 ug via INTRAVENOUS
  Filled 2013-02-22 (×2): qty 2

## 2013-02-22 MED ORDER — LACTATED RINGERS IV SOLN: INTRAVENOUS | Status: DC

## 2013-02-22 MED ORDER — NAPROXEN 250 MG PO TABS: 250.0000 mg | ORAL_TABLET | Freq: Two times a day (BID) | ORAL | Status: DC

## 2013-02-22 MED ORDER — HYDROCODONE-ACETAMINOPHEN 5-325 MG PO TABS
ORAL_TABLET | ORAL | Status: AC
  Administered 2013-02-22: 1 via ORAL
  Filled 2013-02-22: qty 1

## 2013-02-22 MED ORDER — IOHEXOL 300 MG/ML  SOLN
INTRAMUSCULAR | Status: DC | PRN
  Administered 2013-02-22: 50 mL via URETHRAL

## 2013-02-22 MED ORDER — KETOROLAC TROMETHAMINE 30 MG/ML IJ SOLN
30.0000 mg | Freq: Once | INTRAMUSCULAR | Status: AC
  Administered 2013-02-22: 30 mg via INTRAVENOUS
  Filled 2013-02-22: qty 1

## 2013-02-22 MED ORDER — PROPOFOL 10 MG/ML IV BOLUS
INTRAVENOUS | Status: DC | PRN
  Administered 2013-02-22: 190 mg via INTRAVENOUS

## 2013-02-22 MED ORDER — ONDANSETRON HCL 4 MG/2ML IJ SOLN
INTRAMUSCULAR | Status: DC | PRN
  Administered 2013-02-22: 4 mg via INTRAVENOUS

## 2013-02-22 MED ORDER — LACTATED RINGERS IV SOLN
INTRAVENOUS | Status: DC | PRN
  Administered 2013-02-22: 19:00:00 via INTRAVENOUS

## 2013-02-22 MED ORDER — DEXAMETHASONE SODIUM PHOSPHATE 10 MG/ML IJ SOLN
INTRAMUSCULAR | Status: DC | PRN
  Administered 2013-02-22: 10 mg via INTRAVENOUS

## 2013-02-22 MED ORDER — PROMETHAZINE HCL 25 MG/ML IJ SOLN
INTRAMUSCULAR | Status: AC
  Filled 2013-02-22: qty 1

## 2013-02-22 MED ORDER — SODIUM CHLORIDE 0.9 % IV SOLN
INTRAVENOUS | Status: DC
  Administered 2013-02-22: 18:00:00 via INTRAVENOUS

## 2013-02-22 MED ORDER — EPHEDRINE SULFATE 50 MG/ML IJ SOLN
INTRAMUSCULAR | Status: DC | PRN
  Administered 2013-02-22: 5 mg via INTRAVENOUS

## 2013-02-22 MED ORDER — FENTANYL CITRATE 0.05 MG/ML IJ SOLN: 25.0000 ug | Freq: Once | INTRAMUSCULAR | Status: DC

## 2013-02-22 MED ORDER — SODIUM CHLORIDE 0.9 % IV SOLN
1000.0000 mL | Freq: Once | INTRAVENOUS | Status: AC
  Administered 2013-02-22: 1000 mL via INTRAVENOUS

## 2013-02-22 MED ORDER — FENTANYL CITRATE 0.05 MG/ML IJ SOLN
INTRAMUSCULAR | Status: DC | PRN
  Administered 2013-02-22: 50 ug via INTRAVENOUS
  Administered 2013-02-22: 25 ug via INTRAVENOUS
  Administered 2013-02-22: 50 ug via INTRAVENOUS

## 2013-02-22 MED ORDER — HYDROMORPHONE HCL PF 1 MG/ML IJ SOLN
0.2500 mg | INTRAMUSCULAR | Status: DC | PRN
  Administered 2013-02-22: 0.5 mg via INTRAVENOUS

## 2013-02-22 MED ORDER — ONDANSETRON HCL 4 MG/2ML IJ SOLN
4.0000 mg | Freq: Once | INTRAMUSCULAR | Status: AC
  Administered 2013-02-22: 4 mg via INTRAVENOUS
  Filled 2013-02-22: qty 2

## 2013-02-22 MED ORDER — IOHEXOL 300 MG/ML  SOLN
INTRAMUSCULAR | Status: AC
  Filled 2013-02-22: qty 1

## 2013-02-22 MED ORDER — HYDROMORPHONE HCL PF 1 MG/ML IJ SOLN
1.0000 mg | INTRAMUSCULAR | Status: DC | PRN
  Administered 2013-02-22: 1 mg via INTRAVENOUS
  Filled 2013-02-22: qty 1

## 2013-02-22 MED ORDER — MIDAZOLAM HCL 5 MG/5ML IJ SOLN
INTRAMUSCULAR | Status: DC | PRN
  Administered 2013-02-22: 2 mg via INTRAVENOUS

## 2013-02-22 MED ORDER — LIDOCAINE HCL (CARDIAC) 20 MG/ML IV SOLN
INTRAVENOUS | Status: DC | PRN
  Administered 2013-02-22: 60 mg via INTRAVENOUS

## 2013-02-22 MED ORDER — ONDANSETRON HCL 4 MG PO TABS: 4.0000 mg | ORAL_TABLET | Freq: Three times a day (TID) | ORAL | Status: DC | PRN

## 2013-02-22 MED ORDER — ONDANSETRON HCL 4 MG/2ML IJ SOLN
4.0000 mg | INTRAMUSCULAR | Status: AC | PRN
  Administered 2013-02-22 (×2): 4 mg via INTRAVENOUS
  Filled 2013-02-22 (×2): qty 2

## 2013-02-22 MED ORDER — PROMETHAZINE HCL 25 MG/ML IJ SOLN
6.2500 mg | INTRAMUSCULAR | Status: DC | PRN
  Administered 2013-02-22: 6.25 mg via INTRAVENOUS

## 2013-02-22 MED ORDER — TAMSULOSIN HCL 0.4 MG PO CAPS: 0.4000 mg | ORAL_CAPSULE | Freq: Every day | ORAL | Status: DC

## 2013-02-22 MED ORDER — DEXTROSE 5 % IV SOLN
1.0000 g | Freq: Once | INTRAVENOUS | Status: AC
  Administered 2013-02-22: 1 g via INTRAVENOUS
  Filled 2013-02-22: qty 10

## 2013-02-22 MED ORDER — HYDROMORPHONE HCL PF 1 MG/ML IJ SOLN
INTRAMUSCULAR | Status: AC
  Filled 2013-02-22: qty 1

## 2013-02-22 MED ORDER — SODIUM CHLORIDE 0.9 % IR SOLN
Status: DC | PRN
  Administered 2013-02-22: 3000 mL

## 2013-02-22 MED ORDER — SODIUM CHLORIDE 0.9 % IR SOLN
Status: DC | PRN
  Administered 2013-02-22: 1000 mL

## 2013-02-22 MED ORDER — OXYCODONE-ACETAMINOPHEN 5-325 MG PO TABS: ORAL_TABLET | ORAL | Status: DC

## 2013-02-22 SURGICAL SUPPLY — 26 items
ADAPTER CATH URET PLST 4-6FR (CATHETERS) ×2 IMPLANT
ADPR CATH URET STRL DISP 4-6FR (CATHETERS) ×1
BAG URO CATCHER STRL LF (DRAPE) ×2 IMPLANT
BASKET ZERO TIP NITINOL 2.4FR (BASKET) ×2 IMPLANT
BSKT STON RTRVL ZERO TP 2.4FR (BASKET) ×2
CATH CLEAR GEL 3F BACKSTOP (CATHETERS) ×1 IMPLANT
CATH INTERMIT  6FR 70CM (CATHETERS) IMPLANT
CATH URET 5FR 28IN CONE TIP (BALLOONS)
CATH URET 5FR 28IN OPEN ENDED (CATHETERS) ×2 IMPLANT
CATH URET 5FR 70CM CONE TIP (BALLOONS) IMPLANT
CLOTH BEACON ORANGE TIMEOUT ST (SAFETY) ×2 IMPLANT
DRAPE CAMERA CLOSED 9X96 (DRAPES) ×2 IMPLANT
GLOVE BIOGEL M 7.0 STRL (GLOVE) ×2 IMPLANT
GLOVE SURG SS PI 8.0 STRL IVOR (GLOVE) ×2 IMPLANT
GOWN PREVENTION PLUS XLARGE (GOWN DISPOSABLE) ×2 IMPLANT
GOWN STRL NON-REIN LRG LVL3 (GOWN DISPOSABLE) ×4 IMPLANT
GOWN STRL REIN XL XLG (GOWN DISPOSABLE) ×2 IMPLANT
LASER FIBER DISP (UROLOGICAL SUPPLIES) ×1 IMPLANT
MANIFOLD NEPTUNE II (INSTRUMENTS) ×2 IMPLANT
MARKER SKIN DUAL TIP RULER LAB (MISCELLANEOUS) ×2 IMPLANT
NS IRRIG 1000ML POUR BTL (IV SOLUTION) ×2 IMPLANT
PACK CYSTO (CUSTOM PROCEDURE TRAY) ×2 IMPLANT
SHEATH URET ACCESS 12FR/35CM (UROLOGICAL SUPPLIES) ×1 IMPLANT
STENT CONTOUR 6FRX24X.038 (STENTS) ×1 IMPLANT
TUBING CONNECTING 10 (TUBING) ×2 IMPLANT
WIRE COONS/BENSON .038X145CM (WIRE) ×2 IMPLANT

## 2013-02-22 NOTE — Op Note (Signed)
Megan Frazier is a 32 y.o.   02/22/2013  Preop diagnosis: Right distal ureteral calculus, right hydronephrosis  Postop diagnosis: Same  Procedure done: Cystoscopy, right retrograde pyelogram, ureteroscopy with holmium laser, stone extraction, insertion of double-J stent  Surgeon: Wendie Simmer. Tawana Pasch  Anesthesia: Gen.  Indication: Patient is a 32 years old female who was seen in the emergency room this evening with a history of gradual increase of right-sided flank and lower quadrant pain. The pain started last night and was mild. She took Tylenol PM and went to sleep. She will: This morning with gradually increasing pain on the right side. She presented to the emergency room. A CT scan showed an 8 mm stone in the right distal ureter with moderate hydronephrosis. She did not respond to IV fentanyl. She was then advised to have stone manipulation. She is scheduled for the procedure  Procedure: Patient was identified by her wrist band and proper timeout was taken.  Under general anesthesia she was prepped and draped and placed in the dorsolithotomy position. A panendoscope was inserted in the bladder. The bladder mucosa is normal. There is no stone or tumor in the bladder. The ureteral orifices are in normal position and shape.  Right retrograde pyelogram:  A #5 [place] open-ended catheter was passed through the cystoscope and the right ureteral orifice. 5 cc of contrast were then injected through the open-ended catheter. There is a filling defect in the distal ureter consistent with the known stone. The ureter proximal to the filling defect is moderately dilated. A sensor wire was then passed through the open-ended catheter and the open-ended catheter was removed. The sensor wire was left in place as a safety wire. The bladder was then emptied and the cystoscope removed.  A semirigid ureteroscope was passed in the bladder but could not go through the ureteral orifice. The ureteroscope was removed. The  intramural ureter was then dilated with the ureteroscope access sheath. Then the ureteroscope was inserted in the bladder and passed through the intramural ureter and in the distal ureter were the stone was identified. The stone was free-floating in the ureter. I then used the back stopper to prevent proximal migration of the stone. The stone was freely migrating in the ureter and was a moving target. I used a Nitinol basket to secure the stone. I attempted to remove the stone but it was too large to be extracted. I secured the Nitinol basket with a hemostat and then cut the Nitinol basket and removed the ureteroscope. The ureteroscope was then reinserted in the ureter and with the stone within the wires of the basket the stone was fragmented in multiple smaller fragments. In the process of fragmenting the stone the Nitinol basket became loose and was removed. With a Nitinol basket I removed the stone fragments. A small piece of the Nitinol wire basket was floating in the ureter and removed with a grasping forceps. All ureter wires were intact.  Contrast was then injected through the ureteroscope. There was no evidence of filling defect in the ureter and there was no extravasation of contrast. The ureteroscope was removed.  The sensor wire was then backloaded into the cystoscope and a #6 [place] last 24 double-J stent with out the string was passed over the sensor wire. When the tip of the double-J stent was identified in the renal pelvis the sensor wire was removed. The proximal curl of the double-J stent is in the renal pelvis and the distal curl is in the bladder.  The patient tolerated the procedure well and left the OR in satisfactory condition to postanesthesia care unit

## 2013-02-22 NOTE — ED Provider Notes (Signed)
History     CSN: [id number]  Arrival date & time 02/22/13  1333   First MD Initiated Contact with Patient 02/22/13 1407      Chief Complaint  Patient presents with  . Abdominal Pain  . Back Pain    right lower  . Emesis     HPI Pt was seen at 1410. Per pt, c/o sudden onset and persistence of constant right sided flank "pain" that began this morning.  Pt describes the pain as "like my last kidney stone last year," and radiating into the right side of her abd.  Has been associated with multiple intermittent episodes of N/V.  Denies vaginal bleeding/discharge, no dysuria/hematuria, no abd pain, no diarrhea, no black or blood in emesis, no CP/SOB.     Past Medical History  Diagnosis Date  . Migraines   . Kidney stone   . DDD (degenerative disc disease), lumbar   . Sciatica     Past Surgical History  Procedure Laterality Date  . Wisdom tooth extraction      Family History  Problem Relation Age of Onset  . Other Neg Hx     History  Substance Use Topics  . Smoking status: Heavy Tobacco Smoker -- 1.00 packs/day    Types: Cigarettes  . Smokeless tobacco: Never Used  . Alcohol Use: Yes     Comment: Occas.    OB History   Grav Para Term Preterm Abortions TAB SAB Ect Mult Living   2 2 2  0 0 0 0 0 0 2      Review of Systems ROS: Statement: All systems negative except as marked or noted in the HPI; Constitutional: Negative for fever and chills. ; ; Eyes: Negative for eye pain, redness and discharge. ; ; ENMT: Negative for ear pain, hoarseness, nasal congestion, sinus pressure and sore throat. ; ; Cardiovascular: Negative for chest pain, palpitations, diaphoresis, dyspnea and peripheral edema. ; ; Respiratory: Negative for cough, wheezing and stridor. ; ; Gastrointestinal: +N/V. Negative for diarrhea, abdominal pain, blood in stool, hematemesis, jaundice and rectal bleeding. . ; ; Genitourinary: +flank pain. Negative for dysuria and hematuria. ; ;  GYN:  No vaginal bleeding,  no vaginal discharge, no vulvar pain.;; Musculoskeletal: Negative for back pain and neck pain. Negative for swelling and trauma.; ; Skin: Negative for pruritus, rash, abrasions, blisters, bruising and skin lesion.; ; Neuro: Negative for headache, lightheadedness and neck stiffness. Negative for weakness, altered level of consciousness , altered mental status, extremity weakness, paresthesias, involuntary movement, seizure and syncope.       Allergies  Review of patient's allergies indicates no known allergies.  Home Medications   Current Outpatient Rx  Name  Route  Sig  Dispense  Refill  . ibuprofen (ADVIL,MOTRIN) 200 MG tablet   Oral   Take 800 mg by mouth every 6 (six) hours as needed for pain. For headache/pain           BP 128/69  Pulse 79  Temp(Src) 98.5 F (36.9 C) (Oral)  Resp 20  SpO2 99%  LMP 02/15/2013  Physical Exam 1415: Physical examination:  Nursing notes reviewed; Vital signs and O2 SAT reviewed;  Constitutional: Well developed, Well nourished, Well hydrated, Uncomfortable appearing, tearful.; Head:  Normocephalic, atraumatic; Eyes: EOMI, PERRL, No scleral icterus; ENMT: Mouth and pharynx normal, Mucous membranes moist; Neck: Supple, Full range of motion, No lymphadenopathy; Cardiovascular: Regular rate and rhythm, No murmur, rub, or gallop; Respiratory: Breath sounds clear & equal bilaterally, No rales, rhonchi, wheezes.  Speaking full sentences with ease, Normal respiratory effort/excursion; Chest: Nontender, Movement normal; Abdomen: +dry heaving during exam. Soft, Nontender, Nondistended, Normal bowel sounds; Genitourinary: +right CVA tenderness; Spine:  No midline CS, TS, LS tenderness.  +TTP right lumbar paraspinal muscles.;; Extremities: Pulses normal, No tenderness, No edema, No calf edema or asymmetry.; Neuro: AA&Ox3, Major CN grossly intact.  Speech clear. No gross focal motor or sensory deficits in extremities.; Skin: Color normal, Warm, Dry.    ED Course   Procedures    MDM  MDM Reviewed: previous chart, nursing note and vitals Interpretation: CT scan and labs   Results for orders placed during the hospital encounter of 02/22/13  CBC WITH DIFFERENTIAL      Result Value Range   WBC 12.8 (*) 4.0 - 10.5 K/uL   RBC 4.46  3.87 - 5.11 MIL/uL   Hemoglobin 14.5  12.0 - 15.0 g/dL   HCT 16.1  09.6 - 04.5 %   MCV 91.7  78.0 - 100.0 fL   MCH 32.5  26.0 - 34.0 pg   MCHC 35.5  30.0 - 36.0 g/dL   RDW 40.9  81.1 - 91.4 %   Platelets 277  150 - 400 K/uL   Neutrophils Relative 89 (*) 43 - 77 %   Neutro Abs 11.4 (*) 1.7 - 7.7 K/uL   Lymphocytes Relative 9 (*) 12 - 46 %   Lymphs Abs 1.1  0.7 - 4.0 K/uL   Monocytes Relative 2 (*) 3 - 12 %   Monocytes Absolute 0.3  0.1 - 1.0 K/uL   Eosinophils Relative 0  0 - 5 %   Eosinophils Absolute 0.0  0.0 - 0.7 K/uL   Basophils Relative 0  0 - 1 %   Basophils Absolute 0.0  0.0 - 0.1 K/uL  COMPREHENSIVE METABOLIC PANEL      Result Value Range   Sodium 135  135 - 145 mEq/L   Potassium 4.6  3.5 - 5.1 mEq/L   Chloride 100  96 - 112 mEq/L   CO2 23  19 - 32 mEq/L   Glucose, Bld 95  70 - 99 mg/dL   BUN 10  6 - 23 mg/dL   Creatinine, Ser 7.82  0.50 - 1.10 mg/dL   Calcium 9.9  8.4 - 95.6 mg/dL   Total Protein 8.3  6.0 - 8.3 g/dL   Albumin 4.5  3.5 - 5.2 g/dL   AST 28  0 - 37 U/L   ALT 14  0 - 35 U/L   Alkaline Phosphatase 77  39 - 117 U/L   Total Bilirubin 0.7  0.3 - 1.2 mg/dL   GFR calc non Af Amer >90  >90 mL/min   GFR calc Af Amer >90  >90 mL/min  LIPASE, BLOOD      Result Value Range   Lipase 28  11 - 59 U/L  URINALYSIS, MICROSCOPIC ONLY      Result Value Range   Color, Urine RED (*) YELLOW   APPearance TURBID (*) CLEAR   Specific Gravity, Urine 1.016  1.005 - 1.030   pH 8.5 (*) 5.0 - 8.0   Glucose, UA NEGATIVE  NEGATIVE mg/dL   Hgb urine dipstick LARGE (*) NEGATIVE   Bilirubin Urine NEGATIVE  NEGATIVE   Ketones, ur 15 (*) NEGATIVE mg/dL   Protein, ur 213 (*) NEGATIVE mg/dL   Urobilinogen,  UA 1.0  0.0 - 1.0 mg/dL   Nitrite POSITIVE (*) NEGATIVE   Leukocytes, UA SMALL (*) NEGATIVE   WBC,  UA 11-20  <3 WBC/hpf   RBC / HPF TOO NUMEROUS TO COUNT  <3 RBC/hpf   Bacteria, UA MANY (*) RARE   Squamous Epithelial / LPF RARE  RARE   Urine-Other MUCOUS PRESENT    POCT PREGNANCY, URINE      Result Value Range   Preg Test, Ur NEGATIVE  NEGATIVE   Ct Abdomen Pelvis Wo Contrast 02/22/2013  *RADIOLOGY REPORT*  Clinical Data: Low back and right lower abdomen pain.  Vomiting. History of nephrolithiasis.  CT ABDOMEN AND PELVIS WITHOUT CONTRAST  Technique:  Multidetector CT imaging of the abdomen and pelvis was performed following the standard protocol without intravenous contrast.  Comparison: None.  Findings: Moderate dilatation of the right renal collecting system and ureter to the level of a 6 mm distal ureteral calculus in the mid right pelvis, visible on the scout image.  There are also small bilateral pelvic phleboliths more inferiorly.  Normal appearing left kidney and ureter.  No bladder calculi are seen.  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  Normal appearing, retroverted uterus.  3.4 cm left ovarian cyst. 2.4 cm right ovarian cyst.  Minimal free peritoneal fluid, within normal limits of physiological fluid.  Unremarkable noncontrasted appearance of the liver, spleen, pancreas, gallbladder and adrenal glands.  No gross gastrointestinal abnormalities or enlarged lymph nodes.  Clear lung bases.  IMPRESSION:  1.  6 mm distal right ureteral calculus causing moderate right hydronephrosis and hydroureter. 2.  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   Original Report Authenticated By: Beckie Salts, M.D.     1645:  Pt continues to c/o pain and nausea despite multiple doses of IV fentanyl and zofran.  Will dose IV dilaudid for pain.  +UTI, UC pending.  Will start IV rocephin.  Dx and testing d/w  pt and family.  Questions answered.  Verb understanding, agreeable to eval by Uro MD in the ED. T/C to Urology Dr. Brunilda Payor, case discussed, including:  HPI, pertinent PM/SHx, VS/PE, dx testing, ED course and treatment:  Agreeable to come to ED for eval.          Laray Anger, DO 02/23/13 1345

## 2013-02-22 NOTE — ED Notes (Signed)
Pt refused in and out cath- pt urinated.

## 2013-02-22 NOTE — Transfer of Care (Signed)
Immediate Anesthesia Transfer of Care Note  Patient: Megan Frazier  Procedure(s) Performed: Procedure(s): CYSTOSCOPY WITH RETROGRADE PYELOGRAM/URETERAL STENT PLACEMENT (Right) HOLMIUM LASER APPLICATION (Right)  Patient Location: PACU  Anesthesia Type:General  Level of Consciousness: awake, alert , oriented and patient cooperative  Airway & Oxygen Therapy: Patient Spontanous Breathing and Patient connected to face mask oxygen  Post-op Assessment: Report given to PACU RN, Post -op Vital signs reviewed and stable and Patient moving all extremities  Post vital signs: Reviewed and stable  Complications: No apparent anesthesia complications 

## 2013-02-22 NOTE — ED Notes (Signed)
Patient transported to CT 

## 2013-02-22 NOTE — ED Notes (Addendum)
Noticed that Rocephin IV piggyback had not fully infused- infusing small remainder now.  Reported this to OR RN over the phone.

## 2013-02-22 NOTE — ED Notes (Signed)
MD at bedside. 

## 2013-02-22 NOTE — Anesthesia Postprocedure Evaluation (Signed)
  Anesthesia Post-op Note  Patient: Megan Frazier  Procedure(s) Performed: Procedure(s): CYSTOSCOPY WITH RETROGRADE PYELOGRAM/URETERAL STENT PLACEMENT (Right) HOLMIUM LASER APPLICATION (Right)  Patient Location: PACU  Anesthesia Type:General  Level of Consciousness: awake, alert , oriented and patient cooperative  Airway and Oxygen Therapy: Patent airway, on Room Air  Post-op Pain: none  Post-op Assessment: Post-op Vital signs reviewed, Patient's Cardiovascular Status Stable, Respiratory Function Stable, Patent Airway, No signs of Nausea or vomiting, Adequate PO intake and Pain level controlled  Post-op Vital Signs: Reviewed and stable  Complications: No apparent anesthesia complications

## 2013-02-22 NOTE — ED Notes (Signed)
Per pt:  Pt began having lower right back pain that radiated to lower right abdomen followed by vomiting; pt believes she has vomiting about 3-4 times since then.  Pt states that she had kidney stones last year.  Pt took 600 mg of Ibuprofen this morning before 10 AM this morning.  Not tolerating liquids, per pt.

## 2013-02-22 NOTE — ED Notes (Signed)
Pt has taken off all her belongings and pt's family will take it with them

## 2013-02-22 NOTE — Transfer of Care (Signed)
Immediate Anesthesia Transfer of Care Note  Patient: Megan Frazier  Procedure(s) Performed: Procedure(s): CYSTOSCOPY WITH RETROGRADE PYELOGRAM/URETERAL STENT PLACEMENT (Right) HOLMIUM LASER APPLICATION (Right)  Patient Location: PACU  Anesthesia Type:General  Level of Consciousness: awake, alert , oriented and patient cooperative  Airway & Oxygen Therapy: Patient Spontanous Breathing and Patient connected to face mask oxygen  Post-op Assessment: Report given to PACU RN, Post -op Vital signs reviewed and stable and Patient moving all extremities  Post vital signs: Reviewed and stable  Complications: No apparent anesthesia complications

## 2013-02-22 NOTE — Preoperative (Signed)
Beta Blockers   Reason not to administer Beta Blockers:Not Applicable 

## 2013-02-22 NOTE — ED Notes (Signed)
Spoke with Dr. Julien Girt; pt going to OR.  He requested only a signed consent and a set of VS.

## 2013-02-22 NOTE — Anesthesia Preprocedure Evaluation (Addendum)
Anesthesia Evaluation  Patient identified by MRN, date of birth, ID band Patient awake    Reviewed: Allergy & Precautions, H&P , NPO status , Patient's Chart, lab work & pertinent test results  History of Anesthesia Complications (+) MALIGNANT HYPERTHERMIA  Airway Mallampati: II TM Distance: >3 FB Neck ROM: Full    Dental  (+) Teeth Intact and Dental Advisory Given   Pulmonary neg pulmonary ROS,  breath sounds clear to auscultation  Pulmonary exam normal       Cardiovascular negative cardio ROS  Rhythm:Regular Rate:Normal     Neuro/Psych negative neurological ROS  negative psych ROS   GI/Hepatic negative GI ROS, Neg liver ROS,   Endo/Other  negative endocrine ROS  Renal/GU negative Renal ROS  negative genitourinary   Musculoskeletal negative musculoskeletal ROS (+)   Abdominal   Peds  Hematology negative hematology ROS (+)   Anesthesia Other Findings   Reproductive/Obstetrics negative OB ROS                         Anesthesia Physical Anesthesia Plan  ASA: I and emergent  Anesthesia Plan: General   Post-op Pain Management:    Induction: Intravenous  Airway Management Planned: LMA  Additional Equipment:   Intra-op Plan:   Post-operative Plan: Extubation in OR  Informed Consent: I have reviewed the patients History and Physical, chart, labs and discussed the procedure including the risks, benefits and alternatives for the proposed anesthesia with the patient or authorized representative who has indicated his/her understanding and acceptance.   Dental advisory given  Plan Discussed with: CRNA  Anesthesia Plan Comments:         Anesthesia Quick Evaluation

## 2013-02-22 NOTE — ED Notes (Signed)
OR consent completed.

## 2013-02-22 NOTE — H&P (Signed)
History and Physical  Chief Complaint: Right flank and right lower quadrant apin  History of Present Illness: The patient is a 32 years old female who started having mild right flank and back pain last night.  She thought it was related to her back, took tylenol PM and went to sleep.  She woke up this morning with gradually increasing right flank and right lower quadrant pain associated with nausea.  She reports that she had a kidney stone about 2 years ago.  She is not sure whether she passed the stone or not.  CT scan showed moderate right hydronephrosis secondary to a  8 x 5 mm stone in the right distal ureter.  She was given IV Fentanyl but that did not relieve her pain.  Because of the persistence of the pain and the size of the stone I believe she would benefit from stone manipulation or JJ stent to relieve obstruction.  The procedure, risks, benefits were explained to the patient.  The risks include but are not limited to hemorrhage, infection, inability to remove stone, ureteral injury.  She understands and wishes to proceed.  Past Medical History  Diagnosis Date  . Migraines   . Kidney stone   . DDD (degenerative disc disease), lumbar   . Sciatica    Past Surgical History  Procedure Laterality Date  . Wisdom tooth extraction      Medications: Ibuprofen,  Allergies: No Known Allergies  Family History  Problem Relation Age of Onset  . Other Neg Hx    Social History:  reports that she has been smoking Cigarettes.  She has been smoking about 1.00 pack per day. She has never used smokeless tobacco. She reports that  drinks alcohol. She reports that she does not use illicit drugs.  ROS: All systems are reviewed and negative except as noted.   Physical Exam:  Vital signs in last 24 hours: Temp:  [98.5 F (36.9 C)] 98.5 F (36.9 C) (03/29 1353) Pulse Rate:  [79] 79 (03/29 1353) Resp:  [20] 20 (03/29 1353) BP: (128)/(69) 128/69 mmHg (03/29 1353) SpO2:  [99 %] 99 % (03/29  1353) Head: Normal. Pupils are equal and reactive to light. ENT: Within normal limits.  No cervical adenopathy.  No thyromegaly. Cardiovascular: Skin warm; not flushed Respiratory: Breaths quiet; no shortness of breath Abdomen: No masses.  Tender in right flank and right lower quadrant.  Moderate right CVA tenderness Neurological: Normal sensation to touch Musculoskeletal: Normal motor function arms and legs Lymphatics: No inguinal adenopathy Skin: No rashes Genitourinary: Normal female genitalia  Laboratory Data:  Results for orders placed during the hospital encounter of 02/22/13 (from the past 24 hour(s))  CBC WITH DIFFERENTIAL     Status: Abnormal   Collection Time    02/22/13  2:36 PM      Result Value Range   WBC 12.8 (*) 4.0 - 10.5 K/uL   RBC 4.46  3.87 - 5.11 MIL/uL   Hemoglobin 14.5  12.0 - 15.0 g/dL   HCT 16.1  09.6 - 04.5 %   MCV 91.7  78.0 - 100.0 fL   MCH 32.5  26.0 - 34.0 pg   MCHC 35.5  30.0 - 36.0 g/dL   RDW 40.9  81.1 - 91.4 %   Platelets 277  150 - 400 K/uL   Neutrophils Relative 89 (*) 43 - 77 %   Neutro Abs 11.4 (*) 1.7 - 7.7 K/uL   Lymphocytes Relative 9 (*) 12 - 46 %  Lymphs Abs 1.1  0.7 - 4.0 K/uL   Monocytes Relative 2 (*) 3 - 12 %   Monocytes Absolute 0.3  0.1 - 1.0 K/uL   Eosinophils Relative 0  0 - 5 %   Eosinophils Absolute 0.0  0.0 - 0.7 K/uL   Basophils Relative 0  0 - 1 %   Basophils Absolute 0.0  0.0 - 0.1 K/uL  COMPREHENSIVE METABOLIC PANEL     Status: None   Collection Time    02/22/13  2:36 PM      Result Value Range   Sodium 135  135 - 145 mEq/L   Potassium 4.6  3.5 - 5.1 mEq/L   Chloride 100  96 - 112 mEq/L   CO2 23  19 - 32 mEq/L   Glucose, Bld 95  70 - 99 mg/dL   BUN 10  6 - 23 mg/dL   Creatinine, Ser 1.61  0.50 - 1.10 mg/dL   Calcium 9.9  8.4 - 09.6 mg/dL   Total Protein 8.3  6.0 - 8.3 g/dL   Albumin 4.5  3.5 - 5.2 g/dL   AST 28  0 - 37 U/L   ALT 14  0 - 35 U/L   Alkaline Phosphatase 77  39 - 117 U/L   Total Bilirubin 0.7   0.3 - 1.2 mg/dL   GFR calc non Af Amer >90  >90 mL/min   GFR calc Af Amer >90  >90 mL/min  LIPASE, BLOOD     Status: None   Collection Time    02/22/13  2:36 PM      Result Value Range   Lipase 28  11 - 59 U/L  URINALYSIS, MICROSCOPIC ONLY     Status: Abnormal   Collection Time    02/22/13  3:11 PM      Result Value Range   Color, Urine RED (*) YELLOW   APPearance TURBID (*) CLEAR   Specific Gravity, Urine 1.016  1.005 - 1.030   pH 8.5 (*) 5.0 - 8.0   Glucose, UA NEGATIVE  NEGATIVE mg/dL   Hgb urine dipstick LARGE (*) NEGATIVE   Bilirubin Urine NEGATIVE  NEGATIVE   Ketones, ur 15 (*) NEGATIVE mg/dL   Protein, ur 045 (*) NEGATIVE mg/dL   Urobilinogen, UA 1.0  0.0 - 1.0 mg/dL   Nitrite POSITIVE (*) NEGATIVE   Leukocytes, UA SMALL (*) NEGATIVE   WBC, UA 11-20  <3 WBC/hpf   RBC / HPF TOO NUMEROUS TO COUNT  <3 RBC/hpf   Bacteria, UA MANY (*) RARE   Squamous Epithelial / LPF RARE  RARE   Urine-Other MUCOUS PRESENT    POCT PREGNANCY, URINE     Status: None   Collection Time    02/22/13  3:30 PM      Result Value Range   Preg Test, Ur NEGATIVE  NEGATIVE   No results found for this or any previous visit (from the past 240 hour(s)). Creatinine:  Recent Labs  02/22/13 1436  CREATININE 0.80    Xrays: See report/chart   Impression/Assessment:  Right distal ureteral calculus, right hydronephrosis  Plan:  Cystoscopy, right retrograde pyelogram, ureteroscopy with holmium laser, Stone manipulation, JJ stent  Emogene Muratalla-HENRY 02/22/2013, 6:12 PM

## 2013-02-22 NOTE — ED Notes (Signed)
Spoke with Dr. Brunilda Payor and OR nurse- okay to send pt up with recent vitals (BP of 105/59 at 1810- reviewed with Dr. Brunilda Payor at bedside) and skin sanitizing cloths will be done in OR per OR nurse.  ED transported pt to OR.

## 2013-02-24 ENCOUNTER — Encounter (HOSPITAL_COMMUNITY): Payer: Self-pay | Admitting: Urology

## 2013-02-25 LAB — URINE CULTURE

## 2013-10-02 ENCOUNTER — Other Ambulatory Visit: Payer: Self-pay

## 2014-09-28 ENCOUNTER — Encounter (HOSPITAL_COMMUNITY): Payer: Self-pay | Admitting: Urology

## 2015-03-31 ENCOUNTER — Encounter (HOSPITAL_COMMUNITY): Payer: Self-pay | Admitting: Emergency Medicine

## 2015-03-31 ENCOUNTER — Emergency Department (HOSPITAL_COMMUNITY)
Admission: EM | Admit: 2015-03-31 | Discharge: 2015-03-31 | Disposition: A | Discharge status: Home / Self Care | Payer: Medicaid Other | Attending: Emergency Medicine | Admitting: Emergency Medicine

## 2015-03-31 DIAGNOSIS — Z8679 Personal history of other diseases of the circulatory system: Secondary | ICD-10-CM | POA: Insufficient documentation

## 2015-03-31 DIAGNOSIS — G8929 Other chronic pain: Secondary | ICD-10-CM | POA: Insufficient documentation

## 2015-03-31 DIAGNOSIS — Z87442 Personal history of urinary calculi: Secondary | ICD-10-CM | POA: Insufficient documentation

## 2015-03-31 DIAGNOSIS — M25551 Pain in right hip: Secondary | ICD-10-CM | POA: Insufficient documentation

## 2015-03-31 DIAGNOSIS — Z720 Tobacco use: Secondary | ICD-10-CM | POA: Insufficient documentation

## 2015-03-31 DIAGNOSIS — M5430 Sciatica, unspecified side: Secondary | ICD-10-CM | POA: Insufficient documentation

## 2015-03-31 DIAGNOSIS — M545 Low back pain: Secondary | ICD-10-CM | POA: Insufficient documentation

## 2015-03-31 HISTORY — DX: Dorsalgia, unspecified: M54.9

## 2015-03-31 HISTORY — DX: Other chronic pain: G89.29

## 2015-03-31 NOTE — ED Notes (Signed)
Patient endorses chronic lumbo-sacral back pain x 10 years sts she has recently had right hip pain that intermittently shoots down right leg. 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 Pt denies injury, patient feels it Semple be strained from recent moving with lots of bending and lifting boxes. Patient in NAD.

## 2015-03-31 NOTE — ED Provider Notes (Signed)
CSN: [id number]     Arrival date & time 03/31/15  [id number] History   First MD Initiated Contact with Patient 03/31/15 [phone]     Chief Complaint  Patient presents with  . Back Pain    HPI   34 year old female patient with history of chronic back pain 10 years presents today with right hip pain. She reports that over the last few days she has been moving boxes into her new house start to develop soreness with a shooting sensation with forward flexion of her hip. Ports that shooting pain radiates down to the back of her knee and is only associated with movements, brief, sharp pain. Patient reports that symptoms are similar to when she had low back pain with "sciatic" problems. Baseline low lumbar pain for the last 10 years that has not worsened or improved, not currently followed by anybody. As this pain has been remained stable throughout this episode, where she is asymptomatic when laying on her left hip. She notes that she is tried ibuprofen 600 mg 1, did not find relief with that so she discontinued. Patient denies fevers, saddle anesthesia, changes in bowel or bladder function characteristics or frequency, no incontinence or retention, no weakness of the distal extremity, no sensation or perfusion deficits. 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   Past Medical History  Diagnosis Date  . Migraines   . Kidney stone   . DDD (degenerative disc disease), lumbar   . Sciatica   . Chronic back pain    Past Surgical History  Procedure Laterality Date  . Wisdom tooth extraction    . Cystoscopy w/ ureteral stent placement Right 02/22/2013    Procedure: CYSTOSCOPY WITH RETROGRADE PYELOGRAM/URETERAL STENT PLACEMENT;  Surgeon: Lindaann SloughMarc-Henry Nesi, MD;  Location: [facility];  Service: Urology;  Laterality: Right;  . Holmium laser application Right 02/22/2013    Procedure: HOLMIUM LASER APPLICATION;  Surgeon: Lindaann SloughMarc-Henry Nesi, MD;  Location: [facility];  Service: Urology;  Laterality: Right;   Family History  Problem  Relation Age of Onset  . Other Neg Hx    History  Substance Use Topics  . Smoking status: Heavy Tobacco Smoker -- 1.00 packs/day    Types: Cigarettes  . Smokeless tobacco: Never Used  . Alcohol Use: Yes     Comment: Occas.   OB History    Gravida Para Term Preterm AB TAB SAB Ectopic Multiple Living   2 2 2  0 0 0 0 0 0 2     Review of Systems  All other systems reviewed and are negative.  Allergies  Review of patient's allergies indicates no known allergies.  Home Medications   Prior to Admission medications   Medication Sig Start Date End Date Taking? Authorizing Provider  ibuprofen (ADVIL,MOTRIN) 200 MG tablet Take 800 mg by mouth every 6 (six) hours as needed for pain. For headache/pain    Historical Provider, MD   BP 100/58 mmHg  Pulse 76  Temp(Src) 98.7 F (37.1 C) (Oral)  Resp 16  SpO2 100%  LMP 03/09/2015 Physical Exam  Constitutional: She is oriented to person, place, and time. She appears well-developed and well-nourished.  HENT:  Head: Normocephalic and atraumatic.  Eyes: Conjunctivae are normal. Pupils are equal, round, and reactive to light. Right eye exhibits no discharge. Left eye exhibits no discharge. No scleral icterus.  Neck: Normal range of motion. No JVD present. No tracheal deviation present.  Cardiovascular: Normal rate, regular rhythm and normal heart sounds.  Exam reveals no gallop and no  friction rub.   No murmur heard. Pulmonary/Chest: Effort normal. No stridor.  Abdominal: Soft. There is no tenderness.  Musculoskeletal: Normal range of motion. She exhibits no edema or tenderness.  Patient has slight tenderness to palpation of the gluteus maximus with probable gluteus medius involvement. She has minor pain with flexion and internal rotation of the femur, no radiation of symptoms down her leg. No signs of trauma, distal sensation strength function intact no focal deficits. Pain has minor pain with ambulation.  Minor lower back pain with forward  flexion of the hips  Neurological: She is alert and oriented to person, place, and time. Coordination normal.  Psychiatric: She has a normal mood and affect. Her behavior is normal. Judgment and thought content normal.  Nursing note and vitals reviewed.   ED Course  Procedures (including critical care time) Labs Review Labs Reviewed - No data to display  Imaging Review No results found.   EKG Interpretation None      MDM   Final diagnoses:  Right hip pain   Labs: none  Imaging:none  Consults:none  Therapeutics:none  Assessment: Right hip pain  Plan: Patient's presentation likely represents soft tissue pain. She denies any history of trauma, but reports that she has been lifting heavy boxes pending at the hips with an increase in her work load as a Production assistant, radioserver. She describes it as painful and sore and is made worse with active hip extension. Patient denies worsening low back pain, at baseline. She denies fevers or night sweats, saddle anesthesia, bladder or bowel continence or retention, or focal neurological muscular perfusion deficits. Patient reports that he she is only tried ibuprofen 1, she was instructed to use ibuprofen every 6 hours 600 mg for 5 days. She was instructed to use ice and heat, massage, light stretching and avoid aggravating activities. Patient [facility] the event that symptoms do not improve or worsen. She was given strict return instructions in the event worsened symptoms presented. Patient verbalized her understanding to today's plan and agreed to all information discussed. She assured her follow-up evaluation if necessary.      Eyvonne MechanicJeffrey Jiyah Torpey, PA-C 03/31/15 1818  Samuel JesterKathleen McManus, DO 04/04/15 [phone]

## 2015-03-31 NOTE — Discharge Instructions (Signed)
Please use ibuprofen 600 mg every 6 hours for 5 days. Please avoid aggravating activities, use ice, heat, rest. Please follow-up with [place] [facility] if symptoms continue to persist.

## 2015-08-03 ENCOUNTER — Encounter (HOSPITAL_COMMUNITY): Payer: Self-pay | Admitting: Emergency Medicine

## 2015-08-03 ENCOUNTER — Emergency Department (HOSPITAL_COMMUNITY)
Admission: EM | Admit: 2015-08-03 | Discharge: 2015-08-03 | Disposition: A | Discharge status: Home / Self Care | Payer: BLUE CROSS/BLUE SHIELD | Attending: Emergency Medicine | Admitting: Emergency Medicine

## 2015-08-03 DIAGNOSIS — M545 Low back pain, unspecified: Secondary | ICD-10-CM

## 2015-08-03 DIAGNOSIS — Z87442 Personal history of urinary calculi: Secondary | ICD-10-CM | POA: Diagnosis not present

## 2015-08-03 DIAGNOSIS — G8929 Other chronic pain: Secondary | ICD-10-CM | POA: Diagnosis not present

## 2015-08-03 DIAGNOSIS — Z8679 Personal history of other diseases of the circulatory system: Secondary | ICD-10-CM | POA: Diagnosis not present

## 2015-08-03 DIAGNOSIS — Z720 Tobacco use: Secondary | ICD-10-CM | POA: Diagnosis not present

## 2015-08-03 MED ORDER — CYCLOBENZAPRINE HCL 10 MG PO TABS: 10.0000 mg | ORAL_TABLET | Freq: Two times a day (BID) | ORAL | Status: DC | PRN

## 2015-08-03 MED ORDER — NAPROXEN 500 MG PO TABS: 500.0000 mg | ORAL_TABLET | Freq: Two times a day (BID) | ORAL | Status: DC

## 2015-08-03 MED ORDER — KETOROLAC TROMETHAMINE 60 MG/2ML IM SOLN
30.0000 mg | Freq: Once | INTRAMUSCULAR | Status: AC
  Administered 2015-08-03: 30 mg via INTRAMUSCULAR
  Filled 2015-08-03: qty 2

## 2015-08-03 NOTE — ED Notes (Signed)
Hx of chronic LBP. Started 2 days ago with pain.

## 2015-08-03 NOTE — Discharge Instructions (Signed)
Take naprosyn for pain as prescribed. Take flexeril for muscle spasms. Try ice/heat. Stretches. No heavy lifting, pushing, pulling. Follow up with primary care doctor.   Back Pain, Adult Low back pain is very common. About 1 in 5 people have back pain.The cause of low back pain is rarely dangerous. The pain often gets better over time.About half of people with a sudden onset of back pain feel better in just 2 weeks. About 8 in 10 people feel better by 6 weeks.  CAUSES Some common causes of back pain include:  Strain of the muscles or ligaments supporting the spine.  Wear and tear (degeneration) of the spinal discs.  Arthritis.  Direct injury to the back. DIAGNOSIS Most of the time, the direct cause of low back pain is not known.However, back pain can be treated effectively even when the exact cause of the pain is unknown.Answering your caregiver's questions about your overall health and symptoms is one of the most accurate ways to make sure the cause of your pain is not dangerous. If your caregiver needs more information, he or she Crandall order lab work or imaging tests (X-rays or MRIs).However, even if imaging tests show changes in your back, this usually does not require surgery. HOME CARE INSTRUCTIONS For many people, back pain returns.Since low back pain is rarely dangerous, it is often a condition that people can learn to [facility] their own.   Remain active. It is stressful on the back to sit or stand in one place. Do not sit, drive, or stand in one place for more than 30 minutes at a time. Take short walks on level surfaces as soon as pain allows.Try to increase the length of time you walk each day.  Do not stay in bed.Resting more than 1 or 2 days can delay your recovery.  Do not avoid exercise or work.Your body is made to move.It is not dangerous to be active, even though your back Kluger hurt.Your back will likely heal faster if you return to being active before your pain is  gone.  Pay attention to your body when you bend and lift. Many people have less discomfortwhen lifting if they bend their knees, keep the load close to their bodies,and avoid twisting. Often, the most comfortable positions are those that put less stress on your recovering back.  Find a comfortable position to sleep. Use a firm mattress and lie on your side with your knees slightly bent. If you lie on your back, put a pillow under your knees.  Only take over-the-counter or prescription medicines as directed by your caregiver. Over-the-counter medicines to reduce pain and inflammation are often the most helpful.Your caregiver Bitner prescribe muscle relaxant drugs.These medicines help dull your pain so you can more quickly return to your normal activities and healthy exercise.  Put ice on the injured area.  Put ice in a plastic bag.  Place a towel between your skin and the bag.  Leave the ice on for 15-20 minutes, 03-04 times a day for the first 2 to 3 days. After that, ice and heat Erhart be alternated to reduce pain and spasms.  Ask your caregiver about trying back exercises and gentle massage. This Larue be of some benefit.  Avoid feeling anxious or stressed.Stress increases muscle tension and can worsen back pain.It is important to recognize when you are anxious or stressed and learn ways to manage it.Exercise is a great option. SEEK MEDICAL CARE IF:  You have pain that is not relieved with  rest or medicine.  You have pain that does not improve in 1 week.  You have new symptoms.  You are generally not feeling well. SEEK IMMEDIATE MEDICAL CARE IF:   You have pain that radiates from your back into your legs.  You develop new bowel or bladder control problems.  You have unusual weakness or numbness in your arms or legs.  You develop nausea or vomiting.  You develop abdominal pain.  You feel faint. Document Released: 11/13/2005 Document Revised: 05/14/2012 Document Reviewed:  03/17/2014 [facility] Patient Information 2015 [place], [place]. This information is not intended to replace advice given to you by your health care provider. Make sure you discuss any questions you have with your health care provider.    Back Exercises Back exercises help treat and prevent back injuries. The goal of back exercises is to increase the strength of your abdominal and back muscles and the flexibility of your back. These exercises should be started when you no longer have back pain. Back exercises include:  Pelvic Tilt. Lie on your back with your knees bent. Tilt your pelvis until the lower part of your back is against the floor. Hold this position 5 to 10 sec and repeat 5 to 10 times.  Knee to Chest. Pull first 1 knee up against your chest and hold for 20 to 30 seconds, repeat this with the other knee, and then both knees. This Longbottom be done with the other leg straight or bent, whichever feels better.  Sit-Ups or Curl-Ups. Bend your knees 90 degrees. Start with tilting your pelvis, and do a partial, slow sit-up, lifting your trunk only 30 to 45 degrees off the floor. Take at least 2 to 3 seconds for each sit-up. Do not do sit-ups with your knees out straight. If partial sit-ups are difficult, simply do the above but with only tightening your abdominal muscles and holding it as directed.  Hip-Lift. Lie on your back with your knees flexed 90 degrees. Push down with your feet and shoulders as you raise your hips a couple inches off the floor; hold for 10 seconds, repeat 5 to 10 times.  Back arches. Lie on your stomach, propping yourself up on bent elbows. Slowly press on your hands, causing an arch in your low back. Repeat 3 to 5 times. Any initial stiffness and discomfort should lessen with repetition over time.  Shoulder-Lifts. Lie face down with arms beside your body. Keep hips and torso pressed to floor as you slowly lift your head and shoulders off the floor. Do not overdo your  exercises, especially in the beginning. Exercises Gilson cause you some mild back discomfort which lasts for a few minutes; however, if the pain is more severe, or lasts for more than 15 minutes, do not continue exercises until you see your caregiver. Improvement with exercise therapy for back problems is slow.  See your caregivers for assistance with developing a proper back exercise program. Document Released: 12/21/2004 Document Revised: 02/05/2012 Document Reviewed: 09/14/2011 [facility] Patient Information 2015 [place], [place]. This information is not intended to replace advice given to you by your health care provider. Make sure you discuss any questions you have with your health care provider.  Emergency Department Resource Guide 1) Find a Doctor and Pay Out of Pocket Although you won't have to find out who is covered by your insurance plan, it is a good idea to ask around and get recommendations. You will then need to call the office and see if the doctor  you have chosen will accept you as a new patient and what types of options they offer for patients who are self-pay. Some doctors offer discounts or will set up payment plans for their patients who do not have insurance, but you will need to ask so you aren't surprised when you get to your appointment.  2) Contact Your Local Health Department Not all health departments have doctors that can see patients for sick visits, but many do, so it is worth a call to see if yours does. If you don't know where your local health department is, you can check in your phone book. The CDC also has a tool to help you locate your state's health department, and many state websites also have listings of all of their local health departments.  3) Find a Walk-in Clinic If your illness is not likely to be very severe or complicated, you [place]e want to try a walk in clinic. These are popping up all over the country in pharmacies, drugstores, and shopping centers. They're  usually staffed by nurse practitioners or physician assistants that have been trained to treat common illnesses and complaints. They're usually fairly quick and inexpensive. However, if you have serious medical issues or chronic medical problems, these are probably not your best option.  No Primary Care Doctor: - Call Health Connect at  [phone] - they can help you locate a primary care doctor that  accepts your insurance, provides certain services, etc. - Physician Referral Service- [phone]  Chronic Pain Problems: Organization         Address  Phone   Notes  [facility]  [phone] Patients need to be referred by their primary care doctor.   Medication Assistance: Organization         Address  Phone   Notes  [facility] Medication [facility] [address] [place] [place], [place] [address] [phone] --Must be a resident of [place] -- Must have NO insurance coverage whatsoever (no Medicaid/ Medicare, etc.) -- The pt. MUST have a primary care doctor that directs their care regularly and follows them in the community   [facility]  [phone]   [employer]  [phone]    Agencies that provide inexpensive medical care: Organization         Address  Phone   Notes  [facility]  [phone]   [facility]    [phone]   [facility] [address] [place], [place] [address] [phone]   [facility] [address] [place], [place] [phone]   Planned Parenthood    [phone]   [facility]    [phone]   Community [facility] and [facility]  [address], [place] Phone:  [phone], Fax:  [phone] Hours of Operation:  9 am - 6 pm, M-F.  Also accepts Medicaid/Medicare and self-pay.  [facility]  [address], [place] Phone: [phone], Fax: [phone]. Hours  of Operation:  8:30 am - 5:30 pm, M-F.  Also accepts Medicaid and self-pay.  [facility] [place] [address], [place] Phone: [phone]   [facility] [address] Natasha Bence [place], [place] [phone], Ext. [phone] Mondays & Thursdays: 7-9 AM.  First 15 patients are seen on a first come, first serve basis.    Medicaid-accepting [facility] Providers:  Organization  Address  Phone   Notes  [facility] [address], [place] [phone] Also accepts self-pay patients.  [facility] [address] Laurell Josephs [place], [place]  [phone]   [facility] [address], [place] [phone]   [facility] [address] [place] [phone]   Renaye Rakers [address], [place]   [phone] Only accepts [place] Access [place] patients after they have their name applied to their card.   Self-Pay (no insurance) in [facility]:  Organization         Address  Phone   Notes  Sickle Cell Patients, [facility] Internal Medicine [address] [place], [place] [phone]   [facility] [address] [place], [place] [phone]   [facility] [place]  [address], [place] [phone]   [facility]/Dr. Osei-Bonsu  [address], [place] or [address], [place] [phone] Phone number for both [place] and [place] locations is the same.  [facility] [address], [place] [phone]   [facility] [address] [place] or [address] [phone]   [facility] [address] [place] [phone], phone; [phone], fax Sees patients 1st and 3rd Saturday of every month.  Must not qualify for public or private insurance (i.e. Medicaid,  Medicare, [place] Health Choice, Veterans' Benefits)  Household income should be no more than 200% of the poverty level The clinic cannot treat you if you are pregnant or think you are pregnant  Sexually transmitted diseases are not treated at the clinic.    Dental Care: Organization         Address  Phone  Notes  [facility] Department of [facility] [address] [place], [place] [phone] Accepts children up to age 40 who are enrolled in [place] or [place] Health Choice; pregnant women with a Medicaid card; and children who have applied for Medicaid or [place] Health Choice, but were declined, whose parents can pay a reduced fee at time of service.  [facility] Department of [facility]  [address], [place] [phone] Accepts children up to age 58 who are enrolled in [place] or [place] Health Choice; pregnant women with a Medicaid card; and children who have applied for Medicaid or [place] Health Choice, but were declined, whose parents can pay a reduced fee at time of service.  [facility]  [address] [place], [place] [phone] Patients are seen by appointment only. Walk-ins are not accepted. [facility] will see patients 10 years of age and older. Monday - Tuesday (8am-5pm) Most Wednesdays (8:30-5pm) $30 per visit, cash only  [facility] Dental Access PRO[facility]  [address], [facility] [phone] Patients are seen by appointment only. Walk-ins are not accepted. [facility] will see patients 42 years of age and older. One Wednesday Evening (Monthly: Volunteer Based).  $30 per visit, cash only  [employer]  [phone] for adults; Children under age 7, call Graduate Pediatric Dentistry at [phone]. Children aged 78-14, please call [phone] to request a pediatric application.  Dental services are provided in all areas of dental care including fillings,  crowns  and bridges, complete and partial dentures, implants, gum treatment, root canals, and extractions. Preventive care is also provided. Treatment is provided to both adults and children. Patients are selected via a lottery and there is often a waiting list.   [facility] [address] [place]  [phone] [email or web address]   [facility] [address] [place], [place] [phone], Ext. [phone] Second and Fourth Thursday of each month, opens at 6:30 AM; Clinic ends at 9 AM.  Patients are seen on a first-come first-served basis, and a limited number are seen during each clinic.   [facility]  [address] Ether Griffins [place], [place] [phone]   Eligibility Requirements You must have lived in [place], [place], or [place] counties for at least the last three months.   You cannot be eligible for state or federal sponsored [place], including [employer], [place], or [employer].   You generally cannot be eligible for healthcare insurance through your employer.    How to apply: Eligibility screenings are held every Tuesday and Wednesday afternoon from 1:00 pm until 4:00 pm. You do not need an appointment for the interview!  [facility] [address], [place], [place] [phone]   [facility] Health Department  [phone]   [place] [facility]  [phone]   [facility] Health Department  [phone]    Behavioral Health Resources in the Community: Intensive Outpatient Programs Organization         Address  Phone  Notes  [facility] [address] [place], [place] [phone]   [facility] Outpatient [address] [place] [employer], [place] [phone]   [facility] [address] [place], [place]  [phone]   [facility] Mental Health [address]  [place], [place] [phone] or [phone]   Substance Abuse  Resources Organization         Address  Phone  Notes  [facility] Drug Services  [phone]   [facility]  [phone]   [facility] [place]  [phone]   Floydene Flock  [phone]   Residential & Outpatient Substance Abuse Program  [phone]   Psychological Services Organization         Address  Phone  Notes  [facility] Behavioral Health  [phone]   [facility]  [phone]   [facility] Mental Health [address], [place] [phone] or [phone]    Mobile Crisis Teams Organization         Address  Phone  Notes  [facility], Mobile Crisis Care Unit  [phone]   [facility]  [address] [place], [place] [phone]   Doristine Locks [address] [place] [phone]    Self-Help/Support Groups Organization         Address  Phone             Notes  [facility] [place] - variety of support groups  336- I7437963 Call for more information  Narcotics Anonymous (NA), [facility] [address], [employer] [place]  2 meetings at this location   Statistician         Address  Phone  Notes  [facility] [address],    [place]  [phone]   [facility]  [address] [place] [address], [place], [place] [phone]   [facility] [address] [place], [place] [phone] Admissions: 8am-3pm M-F  [facility] [address]    [place], [place] [phone]   [facility] [address] [place], [place], [place] [phone]   [facility] [address]  [place], [place] [phone]   [facility] - Intensive Outpatient [address] Laurell Josephs [address], [place], [place] [phone]   [facility] ([facility] [address] [place]  [place], [place] [phone] or [phone]   [facility] ([facility]) [address], [place], [place] [phone] Accepts Medicaid  [facility] [address],  [place] [phone] Substance Abuse/Addiction Treatment   [facility] Organization         Address  Phone  Notes  [facility]  [phone]   Angie Fava, PhD [address] Ervin Knack [place], [place]   [phone] or [phone]   [facility] Behavioral   [address] [place], [place] [phone]   [facility] [address] [place], [place] [phone] Insurance/Medicaid/sponsorship through [facility] [address]                                    [place], [place] [phone] Therapy/tele-psych/case  [facility] [address], [place] [phone]    Dr. Lolly Mustache  [phone]   [facility]  [employer] [facility] Dept. 1) [address], [place] 2) [address], [place] 3)  [address], [place] [phone]  [phone]   [facility] Child Abuse Hotline [phone] or [phone] (After Hours)

## 2015-08-03 NOTE — ED Provider Notes (Signed)
CSN: [id number]     Arrival date & time 08/03/15  0908 History  This chart was scribed for non-physician practitioner, Lottie Mussel, PA-C, working with Gilda Crease, MD, by Ronney Lion, ED Scribe. This patient was seen in room TR09C/TR09C and the patient's care was started at 10:05 AM.    Chief Complaint  Patient presents with  . Back Pain   The history is provided by the patient. No language interpreter was used.    HPI Comments: Megan Frazier is a 34 y.o. female with a history of chronic low back pain - DDD (lumbar) and sciatica, who presents to the Emergency Department complaining of an exacerbation of her low back pain which began 3 days ago, when she was playing with dogs that jumped up on her. Patient states she was seen here at the ED about 2 months ago for a similar pain and at that time had shooting pain down her bilateral legs; she states this pain is similar, but with numbness and tingling down the right leg. Patient has tried taking 3 x ibuprofen at a time with minimal relief.  Patient has a history of back pain but has not followed up with a PCP, stating she does not have PCP due to insurance issues. She notes she did see a back specialist several years ago, but hasn't been able to continue seeing one due to insurance issues. Patient states she works in a job that involves frequent heavy lifting. She denies any bowel or bladder incontinence, weakness, or numbness.  She denies a history of any DM.   Past Medical History  Diagnosis Date  . Migraines   . Kidney stone   . DDD (degenerative disc disease), lumbar   . Sciatica   . Chronic back pain    Past Surgical History  Procedure Laterality Date  . Wisdom tooth extraction    . Cystoscopy w/ ureteral stent placement Right 02/22/2013    Procedure: CYSTOSCOPY WITH RETROGRADE PYELOGRAM/URETERAL STENT PLACEMENT;  Surgeon: Lindaann Slough, MD;  Location: [facility];  Service: Urology;  Laterality: Right;  . Holmium laser  application Right 02/22/2013    Procedure: HOLMIUM LASER APPLICATION;  Surgeon: Lindaann Slough, MD;  Location: [facility];  Service: Urology;  Laterality: Right;   Family History  Problem Relation Age of Onset  . Other Neg Hx    Social History  Substance Use Topics  . Smoking status: Heavy Tobacco Smoker -- 1.00 packs/day    Types: Cigarettes  . Smokeless tobacco: Never Used  . Alcohol Use: Yes     Comment: Occas.   OB History    Gravida Para Term Preterm AB TAB SAB Ectopic Multiple Living   2 2 2  0 0 0 0 0 0 2     Review of Systems  Genitourinary:       Negative for incontinence  Musculoskeletal: Positive for back pain.  Neurological: Negative for weakness and numbness.   Allergies  Review of patient's allergies indicates no known allergies.  Home Medications   Prior to Admission medications   Medication Sig Start Date End Date Taking? Authorizing Provider  ibuprofen (ADVIL,MOTRIN) 200 MG tablet Take 800 mg by mouth every 6 (six) hours as needed for pain. For headache/pain    Historical Provider, MD   BP 125/79 mmHg  Pulse 103  Temp(Src) 98.1 F (36.7 C) (Oral)  Resp 16  Ht 5' (1.524 m)  Wt 105 lb (47.628 kg)  BMI 20.51 kg/m2  SpO2 100%  LMP  08/02/2015 Physical Exam  Constitutional: She is oriented to person, place, and time. She appears well-developed and well-nourished. No distress.  HENT:  Head: Normocephalic and atraumatic.  Eyes: Conjunctivae and EOM are normal.  Neck: Neck supple. No tracheal deviation present.  Cardiovascular: Normal rate.   Pulmonary/Chest: Effort normal. No respiratory distress.  Musculoskeletal: Normal range of motion.  No midline lumbar spine tenderness. Tender to palpation over right paralumbar muscles. No pain with bilateral straight leg raise. No pain in SI joint or buttock.  Neurological: She is alert and oriented to person, place, and time.  5/5 and equal lower extremity strength. 2+ and equal patellar reflexes bilaterally. Pt able  to dorsiflex bilateral toes and feet with good strength against resistance. Equal sensation bilaterally over thighs and lower legs.   Skin: Skin is warm and dry.  Psychiatric: She has a normal mood and affect. Her behavior is normal.  Nursing note and vitals reviewed.   ED Course  Procedures (including critical care time)  DIAGNOSTIC STUDIES: Oxygen Saturation is 100% on RA, normal by my interpretation.    COORDINATION OF CARE: 10:10 AM - Discussed treatment plan with pt at bedside which includes rest, heat, ice, and anti-inflammatories, and muscle relaxants. Will give work note for a few days. Patient advised that if she sees no improvement, she [place] have to have an MRI and physical therapy. Pt verbalized understanding and agreed to plan.   MDM   Final diagnoses:  Right-sided low back pain without sciatica   Patient with acute on chronic exacerbation of lower back pain. MRI in Epic from 2008 reviewed with several degenerative disks. At this time patient does not have any radicular pain, no new numbness or weakness, no trouble controlling her bowels or bladder. No signs of cauda equina. Pain most likely muscular, patient does heavy lifting at work. Will discharge home with Flexeril, naproxen, no heavy lifting for a week, exercises. Follow-up with primary care doctor.  Filed Vitals:   08/03/15 0914  BP: 125/79  Pulse: 103  Temp: 98.1 F (36.7 C)  Resp: 16  I personally performed the services described in this documentation, which was scribed in my presence. The recorded information has been reviewed and is accurate.   Jaynie Crumble, PA-C 08/03/15 1633  Gilda Crease, MD 08/04/15 [phone]

## 2016-03-31 ENCOUNTER — Emergency Department (HOSPITAL_COMMUNITY)
Admission: EM | Admit: 2016-03-31 | Discharge: 2016-03-31 | Disposition: A | Discharge status: Home / Self Care | Payer: Managed Care, Other (non HMO) | Attending: Emergency Medicine | Admitting: Emergency Medicine

## 2016-03-31 ENCOUNTER — Encounter (HOSPITAL_COMMUNITY): Payer: Self-pay

## 2016-03-31 DIAGNOSIS — G43909 Migraine, unspecified, not intractable, without status migrainosus: Secondary | ICD-10-CM | POA: Diagnosis not present

## 2016-03-31 DIAGNOSIS — F17210 Nicotine dependence, cigarettes, uncomplicated: Secondary | ICD-10-CM | POA: Diagnosis not present

## 2016-03-31 DIAGNOSIS — Z791 Long term (current) use of non-steroidal anti-inflammatories (NSAID): Secondary | ICD-10-CM | POA: Insufficient documentation

## 2016-03-31 DIAGNOSIS — M549 Dorsalgia, unspecified: Secondary | ICD-10-CM

## 2016-03-31 DIAGNOSIS — M79604 Pain in right leg: Secondary | ICD-10-CM | POA: Diagnosis not present

## 2016-03-31 DIAGNOSIS — M5441 Lumbago with sciatica, right side: Secondary | ICD-10-CM | POA: Diagnosis not present

## 2016-03-31 DIAGNOSIS — G8929 Other chronic pain: Secondary | ICD-10-CM | POA: Insufficient documentation

## 2016-03-31 DIAGNOSIS — Z87442 Personal history of urinary calculi: Secondary | ICD-10-CM | POA: Diagnosis not present

## 2016-03-31 DIAGNOSIS — R269 Unspecified abnormalities of gait and mobility: Secondary | ICD-10-CM | POA: Insufficient documentation

## 2016-03-31 DIAGNOSIS — M545 Low back pain: Secondary | ICD-10-CM | POA: Diagnosis present

## 2016-03-31 MED ORDER — IBUPROFEN 800 MG PO TABS: 800.0000 mg | ORAL_TABLET | Freq: Three times a day (TID) | ORAL | Status: DC

## 2016-03-31 MED ORDER — METHOCARBAMOL 500 MG PO TABS: 500.0000 mg | ORAL_TABLET | Freq: Two times a day (BID) | ORAL | Status: DC

## 2016-03-31 MED ORDER — PREDNISONE 20 MG PO TABS: ORAL_TABLET | ORAL | Status: DC

## 2016-03-31 MED ORDER — METHOCARBAMOL 500 MG PO TABS
500.0000 mg | ORAL_TABLET | Freq: Once | ORAL | Status: AC
  Administered 2016-03-31: 500 mg via ORAL
  Filled 2016-03-31: qty 1

## 2016-03-31 MED ORDER — PREDNISONE 20 MG PO TABS
60.0000 mg | ORAL_TABLET | Freq: Once | ORAL | Status: AC
  Administered 2016-03-31: 60 mg via ORAL
  Filled 2016-03-31: qty 3

## 2016-03-31 MED ORDER — HYDROCODONE-ACETAMINOPHEN 5-325 MG PO TABS
1.0000 | ORAL_TABLET | Freq: Once | ORAL | Status: AC
  Administered 2016-03-31: 1 via ORAL
  Filled 2016-03-31: qty 1

## 2016-03-31 NOTE — ED Notes (Signed)
Pt here with c/o back pain and has been going on for many years. 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 Hx of DDD and sciatica.

## 2016-03-31 NOTE — ED Provider Notes (Signed)
CSN: [id number]     Arrival date & time 03/31/16  0226 History   First MD Initiated Contact with Patient 03/31/16 [phone]     Chief Complaint  Patient presents with  . Back Pain     (Consider location/radiation/quality/duration/timing/severity/associated sxs/prior Treatment) The history is provided by the patient and medical records. No language interpreter was used.     Megan Frazier is a 35 y.o. female  with a hx of DDD, sciatica, chronic back pain, kidney stones presents to the Emergency Department complaining of gradual, persistent, progressively worsening low back pain onset "several days ago."  She cannot remember when.  She reports she was at work when the pain began.  She reports she works at a [employer] and is on her feet a lot.  Denies falls or known trauma.  Denies IVDU, weight loss, fevers, hx of cancer or anticoagulants.  Pt reports back pain is sharp, midline and radiates to the right and down the right leg just past the knee.  SHe reports this is similar to previous episodes of sciatica.  She does not remember what she has been treated with in the past.  No saddle anesthesia, loss of bowel or bladder, hematuria or flank pain.  Pt reports taking tylenol and ibuprofen without relief, but is not taking an adequate dose.  Nothing makes it better and bending and walking makes it worse.      Past Medical History  Diagnosis Date  . Migraines   . Kidney stone   . DDD (degenerative disc disease), lumbar   . Sciatica   . Chronic back pain    Past Surgical History  Procedure Laterality Date  . Wisdom tooth extraction    . Cystoscopy w/ ureteral stent placement Right 02/22/2013    Procedure: CYSTOSCOPY WITH RETROGRADE PYELOGRAM/URETERAL STENT PLACEMENT;  Surgeon: Lindaann SloughMarc-Henry Nesi, MD;  Location: [facility];  Service: Urology;  Laterality: Right;  . Holmium laser application Right 02/22/2013    Procedure: HOLMIUM LASER APPLICATION;  Surgeon: Lindaann SloughMarc-Henry Nesi, MD;  Location: [facility];   Service: Urology;  Laterality: Right;   Family History  Problem Relation Age of Onset  . Other Neg Hx    Social History  Substance Use Topics  . Smoking status: Heavy Tobacco Smoker -- 1.00 packs/day    Types: Cigarettes  . Smokeless tobacco: Never Used  . Alcohol Use: Yes     Comment: Occas.   OB History    Gravida Para Term Preterm AB TAB SAB Ectopic Multiple Living   2 2 2  0 0 0 0 0 0 2     Review of Systems  Constitutional: Negative for fever and fatigue.  Respiratory: Negative for chest tightness and shortness of breath.   Cardiovascular: Negative for chest pain.  Gastrointestinal: Negative for nausea, vomiting, abdominal pain and diarrhea.  Genitourinary: Negative for dysuria, urgency, frequency and hematuria.  Musculoskeletal: Positive for back pain, arthralgias (right leg) and gait problem ( 2/2 pain ). Negative for joint swelling, neck pain and neck stiffness.  Skin: Negative for rash.  Neurological: Negative for weakness, light-headedness, numbness and headaches.  All other systems reviewed and are negative.     Allergies  Review of patient's allergies indicates no known allergies.  Home Medications   Prior to Admission medications   Medication Sig Start Date End Date Taking? Authorizing Provider  cyclobenzaprine (FLEXERIL) 10 MG tablet Take 1 tablet (10 mg total) by mouth 2 (two) times daily as needed for muscle spasms. 08/03/15  Tatyana Kirichenko, PA-C  ibuprofen (ADVIL,MOTRIN) 800 MG tablet Take 1 tablet (800 mg total) by mouth 3 (three) times daily. 03/31/16   Norlene Lanes, PA-C  methocarbamol (ROBAXIN) 500 MG tablet Take 1 tablet (500 mg total) by mouth 2 (two) times daily. 03/31/16   Collins Dimaria, PA-C  naproxen (NAPROSYN) 500 MG tablet Take 1 tablet (500 mg total) by mouth 2 (two) times daily. 08/03/15   Tatyana Kirichenko, PA-C  predniSONE (DELTASONE) 20 MG tablet 3 tabs po daily x 3 days, then 2 tabs x 3 days, then 1.5 tabs x 3 days, then 1 tab x  3 days, then 0.5 tabs x 3 days 03/31/16   Megan Client Vala Raffo, PA-C   BP 124/65 mmHg  Pulse 67  Temp(Src) 98.1 F (36.7 C) (Oral)  Resp 17  SpO2 100%  LMP 03/30/2016 Physical Exam  Constitutional: She appears well-developed and well-nourished. No distress.  HENT:  Head: Normocephalic and atraumatic.  Mouth/Throat: Oropharynx is clear and moist. No oropharyngeal exudate.  Eyes: Conjunctivae are normal.  Neck: Normal range of motion. Neck supple.  Full ROM without pain  Cardiovascular: Normal rate, regular rhythm, normal heart sounds and intact distal pulses.   Pulmonary/Chest: Effort normal and breath sounds normal. No respiratory distress. She has no wheezes.  Abdominal: Soft. She exhibits no distension. There is no tenderness.  Musculoskeletal:  Full range of motion of the T-spine and L-spine Mild TTP of the midline L-spine and right paraspinal muscles No TTP of the midline or paraspinal muscles of the T-spine  Lymphadenopathy:    She has no cervical adenopathy.  Neurological: She is alert. She has normal reflexes.  Reflex Scores:      Bicep reflexes are 2+ on the right side and 2+ on the left side.      Brachioradialis reflexes are 2+ on the right side and 2+ on the left side.      Patellar reflexes are 2+ on the right side and 2+ on the left side.      Achilles reflexes are 2+ on the right side and 2+ on the left side. Speech is clear and goal oriented, follows commands Normal 5/5 strength in upper and lower extremities bilaterally including dorsiflexion and plantar flexion, strong and equal grip strength Sensation normal to light and sharp touch Moves extremities without ataxia, coordination intact Normal gait Normal balance No Clonus   Skin: Skin is warm and dry. No rash noted. She is not diaphoretic. No erythema.  Psychiatric: She has a normal mood and affect. Her behavior is normal.  Nursing note and vitals reviewed.   ED Course  Procedures (including critical care  time)   MDM   Final diagnoses:  Midline low back pain with right-sided sciatica  Chronic back pain   Megan Frazier presents with low back pain.  Normal neurological exam, no evidence of urinary incontinence or retention, pain is consistently reproducible. There is no evidence of AAA or concern for dissection at this time.   Patient can walk but states is painful.  No loss of bowel or bladder control.  No concern for cauda equina.  No fever, night sweats, weight loss, h/o cancer, IVDU.  Pain treated here in the department with adequate improvement. RICE protocol and pain medicine indicated and discussed with patient. I have also discussed reasons to return immediately to the ER.  Patient expresses understanding and agrees with plan.  Megan Client Colon Rueth, PA-C 03/31/16 [id number]  Geoffery Lyons, MD 03/31/16 [phone]

## 2016-03-31 NOTE — Discharge Instructions (Signed)
1. Medications: robaxin, ibuprofen, prednisone, usual home medications 2. Treatment: rest, drink plenty of fluids, gentle stretching as discussed, alternate ice and heat 3. Follow Up: Please followup with your primary doctor in 3 days for discussion of your diagnoses and further evaluation after today's visit; if you do not have a primary care doctor use the resource guide provided to find one;  Return to the ER for worsening back pain, difficulty walking, loss of bowel or bladder control or other concerning symptoms     Back Exercises The following exercises strengthen the muscles that help to support the back. They also help to keep the lower back flexible. Doing these exercises can help to prevent back pain or lessen existing pain. If you have back pain or discomfort, try doing these exercises 2-3 times each day or as told by your health care provider. When the pain goes away, do them once each day, but increase the number of times that you repeat the steps for each exercise (do more repetitions). If you do not have back pain or discomfort, do these exercises once each day or as told by your health care provider. EXERCISES Single Knee to Chest Repeat these steps 3-5 times for each leg: 1. Lie on your back on a firm bed or the floor with your legs extended. 2. Bring one knee to your chest. Your other leg should stay extended and in contact with the floor. 3. Hold your knee in place by grabbing your knee or thigh. 4. Pull on your knee until you feel a gentle stretch in your lower back. 5. Hold the stretch for 10-30 seconds. 6. Slowly release and straighten your leg. Pelvic Tilt Repeat these steps 5-10 times: 1. Lie on your back on a firm bed or the floor with your legs extended. 2. Bend your knees so they are pointing toward the ceiling and your feet are flat on the floor. 3. Tighten your lower abdominal muscles to press your lower back against the floor. This motion will tilt your pelvis so  your tailbone points up toward the ceiling instead of pointing to your feet or the floor. 4. With gentle tension and even breathing, hold this position for 5-10 seconds. Cat-Cow Repeat these steps until your lower back becomes more flexible: 1. Get into a hands-and-knees position on a firm surface. Keep your hands under your shoulders, and keep your knees under your hips. You Crewe place padding under your knees for comfort. 2. Let your head hang down, and point your tailbone toward the floor so your lower back becomes rounded like the back of a cat. 3. Hold this position for 5 seconds. 4. Slowly lift your head and point your tailbone up toward the ceiling so your back forms a sagging arch like the back of a cow. 5. Hold this position for 5 seconds. Press-Ups Repeat these steps 5-10 times: 1. Lie on your abdomen (face-down) on the floor. 2. Place your palms near your head, about shoulder-width apart. 3. While you keep your back as relaxed as possible and keep your hips on the floor, slowly straighten your arms to raise the top half of your body and lift your shoulders. Do not use your back muscles to raise your upper torso. You Raffety adjust the placement of your hands to make yourself more comfortable. 4. Hold this position for 5 seconds while you keep your back relaxed. 5. Slowly return to lying flat on the floor. Bridges Repeat these steps 10 times: 1. Lie on  your back on a firm surface. 2. Bend your knees so they are pointing toward the ceiling and your feet are flat on the floor. 3. Tighten your buttocks muscles and lift your buttocks off of the floor until your waist is at almost the same height as your knees. You should feel the muscles working in your buttocks and the back of your thighs. If you do not feel these muscles, slide your feet 1-2 inches farther away from your buttocks. 4. Hold this position for 3-5 seconds. 5. Slowly lower your hips to the starting position, and allow your  buttocks muscles to relax completely. If this exercise is too easy, try doing it with your arms crossed over your chest. Abdominal Crunches Repeat these steps 5-10 times: 1. Lie on your back on a firm bed or the floor with your legs extended. 2. Bend your knees so they are pointing toward the ceiling and your feet are flat on the floor. 3. Cross your arms over your chest. 4. Tip your chin slightly toward your chest without bending your neck. 5. Tighten your abdominal muscles and slowly raise your trunk (torso) high enough to lift your shoulder blades a tiny bit off of the floor. Avoid raising your torso higher than that, because it can put too much stress on your low back and it does not help to strengthen your abdominal muscles. 6. Slowly return to your starting position. Back Lifts Repeat these steps 5-10 times: 1. Lie on your abdomen (face-down) with your arms at your sides, and rest your forehead on the floor. 2. Tighten the muscles in your legs and your buttocks. 3. Slowly lift your chest off of the floor while you keep your hips pressed to the floor. Keep the back of your head in line with the curve in your back. Your eyes should be looking at the floor. 4. Hold this position for 3-5 seconds. 5. Slowly return to your starting position. SEEK MEDICAL CARE IF:  Your back pain or discomfort gets much worse when you do an exercise.  Your back pain or discomfort does not lessen within 2 hours after you exercise. If you have any of these problems, stop doing these exercises right away. Do not do them again unless your health care provider says that you can. SEEK IMMEDIATE MEDICAL CARE IF:  You develop sudden, severe back pain. If this happens, stop doing the exercises right away. Do not do them again unless your health care provider says that you can.   This information is not intended to replace advice given to you by your health care provider. Make sure you discuss any questions you have  with your health care provider.   Document Released: 12/21/2004 Document Revised: 08/04/2015 Document Reviewed: 01/07/2015 [employer] Interactive Patient Education [employer]

## 2016-04-09 ENCOUNTER — Encounter (HOSPITAL_COMMUNITY): Payer: Self-pay | Admitting: *Deleted

## 2016-04-09 ENCOUNTER — Emergency Department (HOSPITAL_COMMUNITY)
Admission: EM | Admit: 2016-04-09 | Discharge: 2016-04-09 | Disposition: A | Discharge status: Home / Self Care | Payer: Managed Care, Other (non HMO) | Attending: Emergency Medicine | Admitting: Emergency Medicine

## 2016-04-09 DIAGNOSIS — Z87442 Personal history of urinary calculi: Secondary | ICD-10-CM | POA: Insufficient documentation

## 2016-04-09 DIAGNOSIS — Z791 Long term (current) use of non-steroidal anti-inflammatories (NSAID): Secondary | ICD-10-CM | POA: Diagnosis not present

## 2016-04-09 DIAGNOSIS — Z79899 Other long term (current) drug therapy: Secondary | ICD-10-CM | POA: Diagnosis not present

## 2016-04-09 DIAGNOSIS — F17210 Nicotine dependence, cigarettes, uncomplicated: Secondary | ICD-10-CM | POA: Insufficient documentation

## 2016-04-09 DIAGNOSIS — G8929 Other chronic pain: Secondary | ICD-10-CM | POA: Diagnosis not present

## 2016-04-09 DIAGNOSIS — Z8679 Personal history of other diseases of the circulatory system: Secondary | ICD-10-CM | POA: Diagnosis not present

## 2016-04-09 DIAGNOSIS — M549 Dorsalgia, unspecified: Secondary | ICD-10-CM | POA: Diagnosis present

## 2016-04-09 DIAGNOSIS — M5442 Lumbago with sciatica, left side: Secondary | ICD-10-CM

## 2016-04-09 DIAGNOSIS — M5440 Lumbago with sciatica, unspecified side: Secondary | ICD-10-CM | POA: Insufficient documentation

## 2016-04-09 DIAGNOSIS — M5441 Lumbago with sciatica, right side: Secondary | ICD-10-CM

## 2016-04-09 MED ORDER — KETOROLAC TROMETHAMINE 60 MG/2ML IM SOLN
60.0000 mg | Freq: Once | INTRAMUSCULAR | Status: AC
  Administered 2016-04-09: 60 mg via INTRAMUSCULAR
  Filled 2016-04-09: qty 2

## 2016-04-09 MED ORDER — HYDROCODONE-ACETAMINOPHEN 5-325 MG PO TABS
2.0000 | ORAL_TABLET | Freq: Once | ORAL | Status: AC
  Administered 2016-04-09: 2 via ORAL
  Filled 2016-04-09: qty 2

## 2016-04-09 MED ORDER — PREDNISONE 20 MG PO TABS: 40.0000 mg | ORAL_TABLET | Freq: Every day | ORAL | Status: DC

## 2016-04-09 MED ORDER — DEXAMETHASONE SODIUM PHOSPHATE 10 MG/ML IJ SOLN
10.0000 mg | Freq: Once | INTRAMUSCULAR | Status: AC
  Administered 2016-04-09: 10 mg via INTRAMUSCULAR
  Filled 2016-04-09: qty 1

## 2016-04-09 MED ORDER — IBUPROFEN 800 MG PO TABS: 800.0000 mg | ORAL_TABLET | Freq: Three times a day (TID) | ORAL | Status: DC

## 2016-04-09 MED ORDER — CYCLOBENZAPRINE HCL 10 MG PO TABS: 10.0000 mg | ORAL_TABLET | Freq: Two times a day (BID) | ORAL | Status: DC | PRN

## 2016-04-09 NOTE — ED Provider Notes (Signed)
CSN: [id number]     Arrival date & time 04/09/16  0130 History   First MD Initiated Contact with Patient 04/09/16 [phone]     Chief Complaint  Patient presents with  . Back Pain     (Consider location/radiation/quality/duration/timing/severity/associated sxs/prior Treatment) HPI  Megan Frazier is a 35 y.o. female with hx of DDD, sciatica, chronic back pain, kidney stones and migraines, presents to the ER with low back pain x2 days which occurred after repetitive lifting and carrying activities at work.  Pain is similar to past episodes of worsening chronic low pain pain.  Pain is located in low midline back with radiation down both legs, described as sharp and stabbing, rated 10/10, worse with movement or palpation, not improved by anything.  She describes sharp and intermittnent tingling pain in her legs, which she has had before.  She denies groin or saddle anesthesia, urinary incontinence, fevers, chills, sweats, dysuria, abdominal pain, N, V.  She denies hx of IVDU, CA, chronic steroid use.  She was seen recently for similar flare and did improve, but states she reinjured it two days ago by heavy lifting.   No other complaints.   Past Medical History  Diagnosis Date  . Migraines   . Kidney stone   . DDD (degenerative disc disease), lumbar   . Sciatica   . Chronic back pain    Past Surgical History  Procedure Laterality Date  . Wisdom tooth extraction    . Cystoscopy w/ ureteral stent placement Right 02/22/2013    Procedure: CYSTOSCOPY WITH RETROGRADE PYELOGRAM/URETERAL STENT PLACEMENT;  Surgeon: Lindaann Slough, MD;  Location: [facility];  Service: Urology;  Laterality: Right;  . Holmium laser application Right 02/22/2013    Procedure: HOLMIUM LASER APPLICATION;  Surgeon: Lindaann Slough, MD;  Location: [facility];  Service: Urology;  Laterality: Right;   Family History  Problem Relation Age of Onset  . Other Neg Hx    Social History  Substance Use Topics  . Smoking status: Heavy Tobacco  Smoker -- 1.00 packs/day    Types: Cigarettes  . Smokeless tobacco: Never Used  . Alcohol Use: Yes     Comment: Occas.   OB History    Gravida Para Term Preterm AB TAB SAB Ectopic Multiple Living   0 0 0 0 0 0 2     Review of Systems  All other systems reviewed and are negative.     Allergies  Review of patient's allergies indicates no known allergies.  Home Medications   Prior to Admission medications   Medication Sig Start Date End Date Taking? Authorizing Provider  ibuprofen (ADVIL,MOTRIN) 800 MG tablet Take 1 tablet (800 mg total) by mouth 3 (three) times daily. 03/31/16  Yes Hannah Muthersbaugh, PA-C  methocarbamol (ROBAXIN) 500 MG tablet Take 1 tablet (500 mg total) by mouth 2 (two) times daily. 03/31/16  Yes Hannah Muthersbaugh, PA-C  predniSONE (DELTASONE) 20 MG tablet 3 tabs po daily x 3 days, then 2 tabs x 3 days, then 1.5 tabs x 3 days, then 1 tab x 3 days, then 0.5 tabs x 3 days 03/31/16  Yes Hannah Muthersbaugh, PA-C   BP 125/76 mmHg  Pulse 79  Temp(Src) 98.1 F (36.7 C) (Oral)  Resp 16  SpO2 98%  LMP 03/30/2016 Physical Exam  Constitutional: She is oriented to person, place, and time. She appears well-developed and well-nourished. No distress.  HENT:  Head: Normocephalic and atraumatic.  Right Ear: External ear normal.  Left Ear: External  ear normal.  Nose: Nose normal.  Mouth/Throat: Oropharynx is clear and moist.  Eyes: Conjunctivae and EOM are normal. Pupils are equal, round, and reactive to light. Right eye exhibits no discharge. Left eye exhibits no discharge. No scleral icterus.  Neck: Normal range of motion. Neck supple. No JVD present. No tracheal deviation present.  Cardiovascular: Normal rate, regular rhythm, normal heart sounds and intact distal pulses.   Pulmonary/Chest: Effort normal and breath sounds normal. No stridor. No respiratory distress. She has no wheezes. She has no rales. She exhibits no tenderness.  Abdominal: Soft. Bowel sounds  are normal. She exhibits no distension. There is no tenderness.  Musculoskeletal: Normal range of motion. She exhibits tenderness. She exhibits no edema.       Lumbar back: She exhibits tenderness. She exhibits normal range of motion, no bony tenderness, no swelling and no edema.       Back:  + straight leg raise  Lymphadenopathy:    She has no cervical adenopathy.  Neurological: She is alert and oriented to person, place, and time. She exhibits normal muscle tone. Coordination normal.  Speech is clear and goal oriented, follows commands Major Cranial nerves without deficit, no facial droop Normal strength in upper and lower extremities bilaterally including dorsiflexion and plantar flexion, strong and equal grip strength Sensation normal to light and sharp touch in bilateral LE's Moves extremities without ataxia, coordination intact Antalgic gait  Skin: Skin is warm and dry. She is not diaphoretic. No cyanosis or erythema. No pallor. Nails show no clubbing.  Diffuse hyperpigmented reticulated rash to low back, blanches  Psychiatric: She has a normal mood and affect. Her behavior is normal. Judgment and thought content normal.  Nursing note and vitals reviewed.   ED Course  Procedures (including critical care time) Labs Review Labs Reviewed - No data to display  Imaging Review No results found. I have personally reviewed and evaluated these images and lab results as part of my medical decision-making.   EKG Interpretation None      MDM   Normal neurological exam, no evidence of urinary incontinence or retention, pain is consistently reproducible. There is no evidence of AAA or concern for dissection at this time.   Patient can walk but states is painful.  No loss of bowel or bladder control.  No concern for cauda equina.  No fever, night sweats, weight loss, h/o cancer, IVDU.  Pain treated here in the department with adequate improvement. RICE protocol and pain medicine indicated  and discussed with patient. I have also discussed reasons to return immediately to the ER.  Patient expresses understanding and agrees with plan.  Pt does have chronic back pain with dx of DDD dating back to 2008.  Discussed with pt need to establish care of chronic issue for eval of need for specialist care.  Pt is in agreement with plan.  Final diagnoses:  Bilateral low back pain with sciatica, sciatica laterality unspecified  Chronic back pain        Danelle BerryLeisa Kimmi Acocella, PA-C 04/19/16 2212  Layla MawKristen N Ward, DO 04/20/16 2143

## 2016-04-09 NOTE — Discharge Instructions (Signed)
Chronic Back Pain ° When back pain lasts longer than 3 months, it is called chronic back pain. People with chronic back pain often go through certain periods that are more intense (flare-ups).  °CAUSES °Chronic back pain can be caused by wear and tear (degeneration) on different structures in your back. These structures include: °· The bones of your spine (vertebrae) and the joints surrounding your spinal cord and nerve roots (facets). °· The strong, fibrous tissues that connect your vertebrae (ligaments). °Degeneration of these structures Stopper result in pressure on your nerves. This can lead to constant pain. °HOME CARE INSTRUCTIONS °· Avoid bending, heavy lifting, prolonged sitting, and activities which make the problem worse. °· Take brief periods of rest throughout the day to reduce your pain. Lying down or standing usually is better than sitting while you are resting. °· Take over-the-counter or prescription medicines only as directed by your caregiver. °SEEK IMMEDIATE MEDICAL CARE IF:  °· You have weakness or numbness in one of your legs or feet. °· You have trouble controlling your bladder or bowels. °· You have nausea, vomiting, abdominal pain, shortness of breath, or fainting. °  °This information is not intended to replace advice given to you by your health care provider. Make sure you discuss any questions you have with your health care provider. °  °Document Released: 12/21/2004 Document Revised: 02/05/2012 Document Reviewed: 05/03/2015 °Elsevier Interactive Patient Education ©2016 Elsevier Inc. ° °Sciatica °Sciatica is pain, weakness, numbness, or tingling along the path of the sciatic nerve. The nerve starts in the lower back and runs down the back of each leg. The nerve controls the muscles in the lower leg and in the back of the knee, while also providing sensation to the back of the thigh, lower leg, and the sole of your foot. Sciatica is a symptom of another medical condition. For instance, nerve  damage or certain conditions, such as a herniated disk or bone spur on the spine, pinch or put pressure on the sciatic nerve. This causes the pain, weakness, or other sensations normally associated with sciatica. Generally, sciatica only affects one side of the body. °CAUSES  °· Herniated or slipped disc. °· Degenerative disk disease. °· A pain disorder involving the narrow muscle in the buttocks (piriformis syndrome). °· Pelvic injury or fracture. °· Pregnancy. °· Tumor (rare). °SYMPTOMS  °Symptoms can vary from mild to very severe. The symptoms usually travel from the low back to the buttocks and down the back of the leg. Symptoms can include: °· Mild tingling or dull aches in the lower back, leg, or hip. °· Numbness in the back of the calf or sole of the foot. °· Burning sensations in the lower back, leg, or hip. °· Sharp pains in the lower back, leg, or hip. °· Leg weakness. °· Severe back pain inhibiting movement. °These symptoms Helvey get worse with coughing, sneezing, laughing, or prolonged sitting or standing. Also, being overweight Hoeffner worsen symptoms. °DIAGNOSIS  °Your caregiver will perform a physical exam to look for common symptoms of sciatica. He or she Leyland ask you to do certain movements or activities that would trigger sciatic nerve pain. Other tests Werth be performed to find the cause of the sciatica. These Tex[place] include: °· Blood tests. °· X-rays. °· Imaging tests, such as an MRI or CT scan. °TREATMENT  °Treatment is directed at the cause of the sciatic pain. Sometimes, treatment is not necessary and the pain and discomfort goes away on its own. If treatment is needed, your   caregiver Friesz suggest:  Over-the-counter medicines to relieve pain.  Prescription medicines, such as anti-inflammatory medicine, muscle relaxants, or narcotics.  Applying heat or ice to the painful area.  Steroid injections to lessen pain, irritation, and inflammation around the nerve.  Reducing activity during periods of  pain.  Exercising and stretching to strengthen your abdomen and improve flexibility of your spine. Your caregiver Jeune suggest losing weight if the extra weight makes the back pain worse.  Physical therapy.  Surgery to eliminate what is pressing or pinching the nerve, such as a bone spur or part of a herniated disk. HOME CARE INSTRUCTIONS   Only take over-the-counter or prescription medicines for pain or discomfort as directed by your caregiver.  Apply ice to the affected area for 20 minutes, 3-4 times a day for the first 48-72 hours. Then try heat in the same way.  Exercise, stretch, or perform your usual activities if these do not aggravate your pain.  Attend physical therapy sessions as directed by your caregiver.  Keep all follow-up appointments as directed by your caregiver.  Do not wear high heels or shoes that do not provide proper support.  Check your mattress to see if it is too soft. A firm mattress Adee lessen your pain and discomfort. SEEK IMMEDIATE MEDICAL CARE IF:   You lose control of your bowel or bladder (incontinence).  You have increasing weakness in the lower back, pelvis, buttocks, or legs.  You have redness or swelling of your back.  You have a burning sensation when you urinate.  You have pain that gets worse when you lie down or awakens you at night.  Your pain is worse than you have experienced in the past.  Your pain is lasting longer than 4 weeks.  You are suddenly losing weight without reason. MAKE SURE YOU:  Understand these instructions.  Will watch your condition.  Will get help right away if you are not doing well or get worse.   This information is not intended to replace advice given to you by your health care provider. Make sure you discuss any questions you have with your health care provider.   Document Released: 11/07/2001 Document Revised: 08/04/2015 Document Reviewed: 03/24/2012 Elsevier Interactive Patient Education 2016  Elsevier Inc.   [employer] Therapy Heat therapy can help ease sore, stiff, injured, and tight muscles and joints. Heat relaxes your muscles, which Verret help ease your pain.  RISKS AND COMPLICATIONS If you have any of the following conditions, do not use heat therapy unless your health care provider has approved:  Poor circulation.  Healing wounds or scarred skin in the area being treated.  Diabetes, heart disease, or high blood pressure.  Not being able to feel (numbness) the area being treated.  Unusual swelling of the area being treated.  Active infections.  Blood clots.  Cancer.  Inability to communicate pain. This Stanczak include young children and people who have problems with their brain function (dementia).  Pregnancy. Heat therapy should only be used on old, pre-existing, or long-lasting (chronic) injuries. Do not use heat therapy on new injuries unless directed by your health care provider. HOW TO USE HEAT THERAPY There are several different kinds of heat therapy, including:  Moist heat pack.  Warm water bath.  Hot water bottle.  Electric heating pad.  Heated gel pack.  Heated wrap.  Electric heating pad. Use the heat therapy method suggested by your health care provider. Follow your health care provider's instructions on when and how to  use heat therapy. GENERAL HEAT THERAPY RECOMMENDATIONS  Do not sleep while using heat therapy. Only use heat therapy while you are awake.  Your skin Farooq turn pink while using heat therapy. Do not use heat therapy if your skin turns red.  Do not use heat therapy if you have new pain.  High heat or long exposure to heat can cause burns. Be careful when using heat therapy to avoid burning your skin.  Do not use heat therapy on areas of your skin that are already irritated, such as with a rash or sunburn. SEEK MEDICAL CARE IF:  You have blisters, redness, swelling, or numbness.  You have new pain.  Your pain is worse. MAKE  SURE YOU:  Understand these instructions.  Will watch your condition.  Will get help right away if you are not doing well or get worse.   This information is not intended to replace advice given to you by your health care provider. Make sure you discuss any questions you have with your health care provider.   Document Released: 02/05/2012 Document Revised: 12/04/2014 Document Reviewed: 01/06/2014 Elsevier Interactive Patient Education [employer]2016 Elsevier Inc.

## 2016-04-09 NOTE — ED Notes (Signed)
The pt is c/o lower back pain for  2 weeks.  She was seen here one week ago for the same  More pain since yesterday after working  lmp 2 weeks

## 2017-04-03 ENCOUNTER — Encounter (HOSPITAL_COMMUNITY): Payer: Self-pay

## 2017-04-03 ENCOUNTER — Emergency Department (HOSPITAL_COMMUNITY)
Admission: EM | Admit: 2017-04-03 | Discharge: 2017-04-03 | Disposition: A | Discharge status: Home / Self Care | Payer: Self-pay | Attending: Emergency Medicine | Admitting: Emergency Medicine

## 2017-04-03 ENCOUNTER — Emergency Department (HOSPITAL_COMMUNITY): Payer: Self-pay

## 2017-04-03 DIAGNOSIS — N201 Calculus of ureter: Secondary | ICD-10-CM | POA: Insufficient documentation

## 2017-04-03 DIAGNOSIS — F17210 Nicotine dependence, cigarettes, uncomplicated: Secondary | ICD-10-CM | POA: Insufficient documentation

## 2017-04-03 DIAGNOSIS — Z79899 Other long term (current) drug therapy: Secondary | ICD-10-CM | POA: Insufficient documentation

## 2017-04-03 DIAGNOSIS — R102 Pelvic and perineal pain: Secondary | ICD-10-CM | POA: Insufficient documentation

## 2017-04-03 DIAGNOSIS — R109 Unspecified abdominal pain: Secondary | ICD-10-CM

## 2017-04-03 LAB — URINALYSIS, ROUTINE W REFLEX MICROSCOPIC
Bacteria, UA: NONE SEEN
Bilirubin Urine: NEGATIVE
Glucose, UA: NEGATIVE mg/dL
Ketones, ur: NEGATIVE mg/dL
Nitrite: NEGATIVE
Protein, ur: 100 mg/dL — AB
Specific Gravity, Urine: 1.018 (ref 1.005–1.030)
pH: 6 (ref 5.0–8.0)

## 2017-04-03 LAB — PREGNANCY, URINE: Preg Test, Ur: NEGATIVE

## 2017-04-03 LAB — I-STAT BETA HCG BLOOD, ED (MC, WL, AP ONLY): I-stat hCG, quantitative: <5 m[IU]/mL (ref <5)

## 2017-04-03 LAB — I-STAT CHEM 8, ED
BUN: 10 mg/dL (ref 6–20)
Calcium, Ion: 1.12 mmol/L — ABNORMAL LOW (ref 1.15–1.40)
Chloride: 103 mmol/L (ref 101–111)
Creatinine, Ser: 0.8 mg/dL (ref 0.44–1.00)
Glucose, Bld: 87 mg/dL (ref 65–99)
HCT: 31 % — ABNORMAL LOW (ref 36.0–46.0)
Hemoglobin: 10.5 g/dL — ABNORMAL LOW (ref 12.0–15.0)
Potassium: 3.7 mmol/L (ref 3.5–5.1)
Sodium: 138 mmol/L (ref 135–145)
TCO2: 26 mmol/L (ref 0–100)

## 2017-04-03 LAB — I-STAT CG4 LACTIC ACID, ED: Lactic Acid, Venous: 0.31 mmol/L — ABNORMAL LOW (ref 0.5–1.9)

## 2017-04-03 MED ORDER — OXYCODONE-ACETAMINOPHEN 5-325 MG PO TABS
ORAL_TABLET | ORAL | Status: AC
  Filled 2017-04-03: qty 1

## 2017-04-03 MED ORDER — ONDANSETRON HCL 4 MG PO TABS: 4.0000 mg | ORAL_TABLET | Freq: Four times a day (QID) | ORAL | 0 refills | Status: DC | PRN

## 2017-04-03 MED ORDER — IBUPROFEN 600 MG PO TABS: 600.0000 mg | ORAL_TABLET | Freq: Four times a day (QID) | ORAL | 0 refills | Status: DC | PRN

## 2017-04-03 MED ORDER — HYDROMORPHONE HCL 1 MG/ML IJ SOLN
0.5000 mg | Freq: Once | INTRAMUSCULAR | Status: AC
Start: 2017-04-03 — End: 2017-04-03
  Administered 2017-04-03: 0.5 mg via INTRAMUSCULAR
  Filled 2017-04-03: qty 1

## 2017-04-03 MED ORDER — OXYCODONE-ACETAMINOPHEN 5-325 MG PO TABS: 1.0000 | ORAL_TABLET | ORAL | 0 refills | Status: DC | PRN

## 2017-04-03 MED ORDER — TAMSULOSIN HCL 0.4 MG PO CAPS: 0.4000 mg | ORAL_CAPSULE | Freq: Every day | ORAL | 0 refills | Status: DC

## 2017-04-03 MED ORDER — IBUPROFEN 200 MG PO TABS
600.0000 mg | ORAL_TABLET | Freq: Once | ORAL | Status: AC
  Administered 2017-04-03: 600 mg via ORAL
  Filled 2017-04-03: qty 1

## 2017-04-03 MED ORDER — KETOROLAC TROMETHAMINE 15 MG/ML IJ SOLN
15.0000 mg | Freq: Once | INTRAMUSCULAR | Status: AC
  Administered 2017-04-03: 15 mg via INTRAMUSCULAR
  Filled 2017-04-03: qty 1

## 2017-04-03 MED ORDER — OXYCODONE-ACETAMINOPHEN 5-325 MG PO TABS
1.0000 | ORAL_TABLET | ORAL | Status: DC | PRN
  Administered 2017-04-03: 1 via ORAL

## 2017-04-03 NOTE — ED Triage Notes (Signed)
Pt. Woke up with rt.  Lower abdominal pain into the rt. Flank area.  Pt. Has a hx of kidney stones and does have one on her rt. Side.  She denies any vaginal problems she denies any dysuria or hematuria.  Pt. Is nauseated. Skin is p/w/d

## 2017-04-03 NOTE — ED Provider Notes (Signed)
[facility] Provider Note   CSN: [id number] Arrival date & time: 04/03/17  1041  By signing my name below, I, Sonum Patel, attest that this documentation has been prepared under the direction and in the presence of Raeford Razor, MD. Electronically Signed: Sonum Patel, Neurosurgeon. 04/03/17. 5:33 PM.  History   Chief Complaint Chief Complaint  Patient presents with  . Flank Pain  . Pelvic Pain    The history is provided by the patient. No language interpreter was used.    HPI Comments: Megan Frazier is a 36 y.o. female who presents to the Emergency Department complaining of constant RLQ abdominal pain with radiation to the right flank that began after waking this morning. She describes her pain as sharp in nature and states it is worse with movement. She notes initial associated nausea but none currently. She denies any alleviating factors. She reports similar symptoms with a prior kidney stone but states this episode is not as severe. She denies vaginal bleeding, vaginal discharge, dysuria, hematuria. She denies any abdominal surgeries.   Past Medical History:  Diagnosis Date  . Chronic back pain   . DDD (degenerative disc disease), lumbar   . Kidney stone   . Migraines   . Sciatica     There are no active problems to display for this patient.   Past Surgical History:  Procedure Laterality Date  . CYSTOSCOPY W/ URETERAL STENT PLACEMENT Right 02/22/2013   Procedure: CYSTOSCOPY WITH RETROGRADE PYELOGRAM/URETERAL STENT PLACEMENT;  Surgeon: Lindaann Slough, MD;  Location: [facility];  Service: Urology;  Laterality: Right;  . HOLMIUM LASER APPLICATION Right 02/22/2013   Procedure: HOLMIUM LASER APPLICATION;  Surgeon: Lindaann Slough, MD;  Location: [facility];  Service: Urology;  Laterality: Right;  . WISDOM TOOTH EXTRACTION      OB History    Gravida Para Term Preterm AB Living   2 2 2  0 0 2   SAB TAB Ectopic Multiple Live Births   0 0 0 0         Home Medications    Prior  to Admission medications   Medication Sig Start Date End Date Taking? Authorizing Provider  cyclobenzaprine (FLEXERIL) 10 MG tablet Take 1 tablet (10 mg total) by mouth 2 (two) times daily as needed for muscle spasms. 04/09/16   Danelle Berry, PA-C  ibuprofen (ADVIL,MOTRIN) 800 MG tablet Take 1 tablet (800 mg total) by mouth 3 (three) times daily. 04/09/16   Danelle Berry, PA-C  methocarbamol (ROBAXIN) 500 MG tablet Take 1 tablet (500 mg total) by mouth 2 (two) times daily. 03/31/16   Muthersbaugh, Dahlia Client, PA-C  predniSONE (DELTASONE) 20 MG tablet Take 2 tablets (40 mg total) by mouth daily. Take 40 mg by mouth daily for 3 days, then 20mg  by mouth daily for 3 days, then 10mg  daily for 3 days 04/09/16   Danelle Berry, PA-C    Family History Family History  Problem Relation Age of Onset  . Other Neg Hx     Social History Social History  Substance Use Topics  . Smoking status: Heavy Tobacco Smoker    Packs/day: 1.00    Types: Cigarettes  . Smokeless tobacco: Never Used  . Alcohol use Yes     Comment: Occas.     Allergies   Patient has no known allergies.   Review of Systems Review of Systems  All other systems reviewed and are negative for acute change except as noted in the HPI.   Physical Exam Updated Vital Signs BP  140/72 (BP Location: Left Arm)   Pulse 68   Temp 98 F (36.7 C) (Oral)   Resp 18   Ht 5' (1.524 m)   Wt 105 lb (47.6 kg)   LMP 03/27/2017   SpO2 100%   BMI 20.51 kg/m   Physical Exam  Constitutional: She is oriented to person, place, and time. She appears well-developed and well-nourished. No distress.  HENT:  Head: Normocephalic and atraumatic.  Eyes: EOM are normal.  Neck: Normal range of motion.  Cardiovascular: Normal rate, regular rhythm and normal heart sounds.   Pulmonary/Chest: Effort normal and breath sounds normal.  Abdominal: Soft. She exhibits no distension. There is tenderness. There is no rebound and no guarding.  Mild RUQ and RLQ tenderness.  No CVA tenderness.   Musculoskeletal: Normal range of motion.  Neurological: She is alert and oriented to person, place, and time.  Skin: Skin is warm and dry.  Psychiatric: She has a normal mood and affect. Judgment normal.  Nursing note and vitals reviewed.    ED Treatments / Results  DIAGNOSTIC STUDIES: Oxygen Saturation is 100% on RA, normal by my interpretation.    COORDINATION OF CARE: 1:27 PM Discussed treatment plan with pt at bedside and pt agreed to plan.   Labs (all labs ordered are listed, but only abnormal results are displayed) Labs Reviewed  URINALYSIS, ROUTINE W REFLEX MICROSCOPIC - Abnormal; Notable for the following:       Result Value   APPearance HAZY (*)    Hgb urine dipstick LARGE (*)    Protein, ur 100 (*)    Leukocytes, UA TRACE (*)    Squamous Epithelial / LPF 0-5 (*)    All other components within normal limits  I-STAT CG4 LACTIC ACID, ED - Abnormal; Notable for the following:    Lactic Acid, Venous 0.31 (*)    All other components within normal limits  I-STAT CHEM 8, ED - Abnormal; Notable for the following:    Calcium, Ion 1.12 (*)    Hemoglobin 10.5 (*)    HCT 31.0 (*)    All other components within normal limits  PREGNANCY, URINE  I-STAT BETA HCG BLOOD, ED ([facility], [facility], [facility] ONLY)  I-STAT CG4 LACTIC ACID, ED    EKG  EKG Interpretation None       Radiology Ct Abdomen Pelvis Wo Contrast  Result Date: 04/03/2017 CLINICAL DATA:  Right flank pain. History of kidney stones with lithotripsy. EXAM: CT ABDOMEN AND PELVIS WITHOUT CONTRAST TECHNIQUE: Multidetector CT imaging of the abdomen and pelvis was performed following the standard protocol without IV contrast. COMPARISON:  CT 02/22/2013. FINDINGS: Lower chest: Clear lung bases. No significant pleural or pericardial effusion. Hepatobiliary: The liver appears unremarkable as imaged in the noncontrast state. No evidence of gallstones, gallbladder wall thickening or biliary dilatation. Pancreas:  Unremarkable. No pancreatic ductal dilatation or surrounding inflammatory changes. Spleen: Normal in size without focal abnormality. Adrenals/Urinary Tract: Both adrenal glands appear normal. There is a large obstructing calculus at the right ureteral pelvic junction, measuring 17 mm on coronal image 53. This is easily visible on the scout image. There is associated right-sided hydronephrosis and proximal hydroureter with asymmetric perinephric soft tissue stranding. Three tiny nonobstructing left renal calculi are present. No evidence of left ureteral or bladder calculus. Stomach/Bowel: No evidence of bowel wall thickening, distention or surrounding inflammatory change. Vascular/Lymphatic: There are no enlarged abdominal or pelvic lymph nodes. No significant vascular findings on noncontrast imaging. Reproductive: No evidence of pelvic mass. Other: Moderate  free pelvic fluid, within physiologic limits. Musculoskeletal: No acute osseous findings. Progressive anterolisthesis at L5-S1 secondary to bilateral L5 pars defects. There is worsening foraminal narrowing and probable bilateral L5 nerve root encroachment. IMPRESSION: 1. Large (17 mm) obstructing calculus at the right ureteral pelvic junction. 2. Several small nonobstructing left renal calculi. 3. 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 Electronically Signed   By: Carey Bullocks M.D.   On: 04/03/2017 16:53    Procedures Procedures (including critical care time)  Medications Ordered in ED Medications  oxyCODONE-acetaminophen (PERCOCET/ROXICET) 5-325 MG per tablet 1 tablet (1 tablet Oral Given 04/03/17 1151)  oxyCODONE-acetaminophen (PERCOCET/ROXICET) 5-325 MG per tablet (not administered)  ibuprofen (ADVIL,MOTRIN) tablet 600 mg (600 mg Oral Given 04/03/17 1357)  HYDROmorphone (DILAUDID) injection 0.5 mg (0.5 mg Intramuscular Given 04/03/17 1555)  ketorolac (TORADOL) 15 MG/ML injection 15 mg (15 mg  Intramuscular Given 04/03/17 1556)     Initial Impression / Assessment and Plan / ED Course  I have reviewed the triage vital signs and the nursing notes.  Pertinent labs & imaging results that were available during my care of the patient were reviewed by me and considered in my medical decision making (see chart for details).    35yF with R flank pain. Very large R proximal ureteral stone. She will need intervention. Afebrile. UA ok. Renal function fine. Symptoms actually pretty well controlled. She has needed previous intervention in 2014 for 8mm stone. Will touch base with urology. Unless they have other recommendations, will give PRN pain/nausea meds and have her follow-up with them this week.   Final Clinical Impressions(s) / ED Diagnoses   Final diagnoses:  Right ureteral stone    New Prescriptions New Prescriptions   No medications on file   I personally preformed the services scribed in my presence. The recorded information has been reviewed is accurate. Raeford Razor, MD.    Raeford Razor, MD 04/08/17 [phone]

## 2017-04-07 ENCOUNTER — Encounter (HOSPITAL_COMMUNITY): Payer: Self-pay | Admitting: Emergency Medicine

## 2017-04-07 ENCOUNTER — Emergency Department (HOSPITAL_COMMUNITY)
Admission: EM | Admit: 2017-04-07 | Discharge: 2017-04-08 | Disposition: A | Discharge status: Home / Self Care | Payer: Managed Care, Other (non HMO) | Attending: Emergency Medicine | Admitting: Emergency Medicine

## 2017-04-07 DIAGNOSIS — F17210 Nicotine dependence, cigarettes, uncomplicated: Secondary | ICD-10-CM | POA: Diagnosis not present

## 2017-04-07 DIAGNOSIS — N201 Calculus of ureter: Secondary | ICD-10-CM | POA: Diagnosis not present

## 2017-04-07 DIAGNOSIS — Z79899 Other long term (current) drug therapy: Secondary | ICD-10-CM | POA: Diagnosis not present

## 2017-04-07 DIAGNOSIS — R1031 Right lower quadrant pain: Secondary | ICD-10-CM | POA: Diagnosis present

## 2017-04-07 LAB — BASIC METABOLIC PANEL
Anion gap: 9 (ref 5–15)
BUN: 13 mg/dL (ref 6–20)
CO2: 24 mmol/L (ref 22–32)
CREATININE: 0.87 mg/dL (ref 0.44–1.00)
Calcium: 8.9 mg/dL (ref 8.9–10.3)
Chloride: 107 mmol/L (ref 101–111)
GFR calc Af Amer: >60 mL/min (ref >60)
Glucose, Bld: 84 mg/dL (ref 65–99)
Potassium: 3.8 mmol/L (ref 3.5–5.1)
SODIUM: 140 mmol/L (ref 135–145)

## 2017-04-07 LAB — URINALYSIS, ROUTINE W REFLEX MICROSCOPIC
Bilirubin Urine: NEGATIVE
Glucose, UA: NEGATIVE mg/dL
Ketones, ur: NEGATIVE mg/dL
Nitrite: NEGATIVE
Protein, ur: NEGATIVE mg/dL
Specific Gravity, Urine: 1.015 (ref 1.005–1.030)
pH: 5 (ref 5.0–8.0)

## 2017-04-07 LAB — CBC WITH DIFFERENTIAL/PLATELET
BASOS ABS: 0 10*3/uL (ref 0.0–0.1)
Basophils Relative: 0 %
EOS ABS: 0.1 10*3/uL (ref 0.0–0.7)
EOS PCT: 1 %
HEMATOCRIT: 33.8 % — AB (ref 36.0–46.0)
Hemoglobin: 11.4 g/dL — ABNORMAL LOW (ref 12.0–15.0)
LYMPHS ABS: 2.6 10*3/uL (ref 0.7–4.0)
Lymphocytes Relative: 35 %
MCH: 31.6 pg (ref 26.0–34.0)
MCHC: 33.7 g/dL (ref 30.0–36.0)
MCV: 93.6 fL (ref 78.0–100.0)
MONO ABS: 0.4 10*3/uL (ref 0.1–1.0)
Monocytes Relative: 5 %
Neutro Abs: 4.4 10*3/uL (ref 1.7–7.7)
Neutrophils Relative %: 59 %
PLATELETS: 230 10*3/uL (ref 150–400)
RBC: 3.61 MIL/uL — AB (ref 3.87–5.11)
RDW: 12.3 % (ref 11.5–15.5)
WBC: 7.4 10*3/uL (ref 4.0–10.5)

## 2017-04-07 LAB — POC URINE PREG, ED: Preg Test, Ur: NEGATIVE

## 2017-04-07 MED ORDER — HYDROMORPHONE HCL 1 MG/ML IJ SOLN
0.5000 mg | Freq: Once | INTRAMUSCULAR | Status: AC
Start: 2017-04-07 — End: 2017-04-07
  Administered 2017-04-07: 0.5 mg via INTRAVENOUS
  Filled 2017-04-07: qty 0.5

## 2017-04-07 MED ORDER — ONDANSETRON HCL 4 MG PO TABS: 4.0000 mg | ORAL_TABLET | Freq: Four times a day (QID) | ORAL | 0 refills | Status: DC

## 2017-04-07 MED ORDER — MORPHINE SULFATE (PF) 4 MG/ML IV SOLN
4.0000 mg | Freq: Once | INTRAVENOUS | Status: AC
  Administered 2017-04-07: 4 mg via INTRAVENOUS
  Filled 2017-04-07: qty 1

## 2017-04-07 MED ORDER — ONDANSETRON HCL 4 MG/2ML IJ SOLN
4.0000 mg | Freq: Once | INTRAMUSCULAR | Status: AC
Start: 2017-04-07 — End: 2017-04-07
  Administered 2017-04-07: 4 mg via INTRAVENOUS
  Filled 2017-04-07: qty 2

## 2017-04-07 MED ORDER — HYDROCODONE-ACETAMINOPHEN 5-325 MG PO TABS: 2.0000 | ORAL_TABLET | ORAL | 0 refills | Status: DC | PRN

## 2017-04-07 MED ORDER — KETOROLAC TROMETHAMINE 30 MG/ML IJ SOLN
15.0000 mg | Freq: Once | INTRAMUSCULAR | Status: AC
Start: 2017-04-07 — End: 2017-04-07
  Administered 2017-04-07: 15 mg via INTRAVENOUS
  Filled 2017-04-07: qty 1

## 2017-04-07 NOTE — Discharge Instructions (Signed)
Continue the pain medicine as prescribed. Use the zofran as needed for nausea. Continue motrin. Make sure you follow up with urology on Monday. Return if you develop fever, uncontrolled pain or vomtignig or for any other reason.

## 2017-04-07 NOTE — ED Triage Notes (Signed)
Pt reports having right flank pain after being dx with 17mm kidney stone at [facility] on 04/03/17. Pt advised that urology was not able to fit her in this week for removal of stone. Pt currently states pain is mostly in groin area. Denies any pain or discomfort with urination.

## 2017-04-09 NOTE — ED Provider Notes (Signed)
[facility]-EMERGENCY DEPT Provider Note   CSN: [id number] Arrival date & time: 04/07/17  2044     History   Chief Complaint Chief Complaint  Patient presents with  . Flank Pain    HPI Megan Frazier is a 36 y.o. female.  HPI   Megan Frazier is a 36 y.o. female who presents to the Emergency Department complaining of constant RLQ abdominal pain with radiation to the right flank that began 4 days ago. She describes her pain as sharp in nature and states it is worse with movement. She notes initial associated nausea but none currently. She denies any alleviating factors. She reports similar symptoms with a prior kidney stone that had to be removed. She denies vaginal bleeding, vaginal discharge, dysuria, hematuria. Pt was dx with 16mm right ureteral stone by ct at the [facility] on 5/8 in the ED. She has follow up with surgery with [facility] in 2 days. States she ran out of pain meds and is not controlled with motrin. Denies any new symptoms. Denies any fever.  Past Medical History:  Diagnosis Date  . Chronic back pain   . DDD (degenerative disc disease), lumbar   . Kidney stone   . Migraines   . Sciatica     There are no active problems to display for this patient.   Past Surgical History:  Procedure Laterality Date  . CYSTOSCOPY W/ URETERAL STENT PLACEMENT Right 02/22/2013   Procedure: CYSTOSCOPY WITH RETROGRADE PYELOGRAM/URETERAL STENT PLACEMENT;  Surgeon: Lindaann Slough, MD;  Location: [facility];  Service: Urology;  Laterality: Right;  . HOLMIUM LASER APPLICATION Right 02/22/2013   Procedure: HOLMIUM LASER APPLICATION;  Surgeon: Lindaann Slough, MD;  Location: [facility];  Service: Urology;  Laterality: Right;  . WISDOM TOOTH EXTRACTION      OB History    Gravida Para Term Preterm AB Living   2 2 2  0 0 2   SAB TAB Ectopic Multiple Live Births   0 0 0 0         Home Medications    Prior to Admission medications   Medication Sig Start Date End Date Taking? Authorizing Provider    cyclobenzaprine (FLEXERIL) 10 MG tablet Take 1 tablet (10 mg total) by mouth 2 (two) times daily as needed for muscle spasms. 04/09/16   Danelle Berry, PA-C  HYDROcodone-acetaminophen (NORCO/VICODIN) 5-325 MG tablet Take 2 tablets by mouth every 4 (four) hours as needed. 04/07/17   Rise Mu, PA-C  ibuprofen (ADVIL,MOTRIN) 600 MG tablet Take 1 tablet (600 mg total) by mouth every 6 (six) hours as needed. 04/03/17   Raeford Razor, MD  ibuprofen (ADVIL,MOTRIN) 800 MG tablet Take 1 tablet (800 mg total) by mouth 3 (three) times daily. 04/09/16   Danelle Berry, PA-C  methocarbamol (ROBAXIN) 500 MG tablet Take 1 tablet (500 mg total) by mouth 2 (two) times daily. 03/31/16   Muthersbaugh, Dahlia Client, PA-C  ondansetron (ZOFRAN) 4 MG tablet Take 1 tablet (4 mg total) by mouth every 6 (six) hours. 04/07/17   Rise Mu, PA-C  oxyCODONE-acetaminophen (PERCOCET/ROXICET) 5-325 MG tablet Take 1-2 tablets by mouth every 4 (four) hours as needed for severe pain. 04/03/17   Raeford Razor, MD  predniSONE (DELTASONE) 20 MG tablet Take 2 tablets (40 mg total) by mouth daily. Take 40 mg by mouth daily for 3 days, then 20mg  by mouth daily for 3 days, then 10mg  daily for 3 days 04/09/16   Danelle Berry, PA-C  tamsulosin (FLOMAX) 0.4 MG CAPS capsule  Take 1 capsule (0.4 mg total) by mouth daily. 04/03/17   Raeford Razor, MD    Family History Family History  Problem Relation Age of Onset  . Other Neg Hx     Social History Social History  Substance Use Topics  . Smoking status: Heavy Tobacco Smoker    Packs/day: 1.00    Types: Cigarettes  . Smokeless tobacco: Never Used  . Alcohol use Yes     Comment: Occas.     Allergies   Patient has no known allergies.   Review of Systems Review of Systems  Constitutional: Negative for chills and fever.  Gastrointestinal: Positive for abdominal pain and nausea. Negative for diarrhea and vomiting.  Genitourinary: Positive for flank pain and hematuria. Negative for  dysuria, urgency, vaginal bleeding and vaginal discharge.     Physical Exam Updated Vital Signs BP (!) 110/94 (BP Location: Right Arm)   Pulse 80   Temp 98.7 F (37.1 C) (Oral)   Resp 16   Ht 5' (1.524 m)   Wt 47.6 kg   LMP 03/31/2017 (Exact Date)   SpO2 100%   BMI 20.51 kg/m   Physical Exam  Constitutional: She appears well-developed and well-nourished. No distress.  Non toxic appearing.   HENT:  Head: Normocephalic and atraumatic.  Mouth/Throat: Oropharynx is clear and moist.  Eyes: Conjunctivae are normal. Right eye exhibits no discharge. Left eye exhibits no discharge. No scleral icterus.  Neck: Normal range of motion. Neck supple. No thyromegaly present.  Cardiovascular: Normal rate, regular rhythm, normal heart sounds and intact distal pulses.   Pulmonary/Chest: Effort normal and breath sounds normal.  Abdominal: Soft. Bowel sounds are normal. She exhibits no distension. There is no tenderness. There is CVA tenderness (right). There is no rigidity, no rebound and no guarding.  Musculoskeletal: Normal range of motion.  Lymphadenopathy:    She has no cervical adenopathy.  Neurological: She is alert.  Skin: Skin is warm and dry.  Nursing note and vitals reviewed.    ED Treatments / Results  Labs (all labs ordered are listed, but only abnormal results are displayed) Labs Reviewed  URINALYSIS, ROUTINE W REFLEX MICROSCOPIC - Abnormal; Notable for the following:       Result Value   APPearance HAZY (*)    Hgb urine dipstick LARGE (*)    Leukocytes, UA TRACE (*)    Bacteria, UA RARE (*)    Squamous Epithelial / LPF 0-5 (*)    All other components within normal limits  CBC WITH DIFFERENTIAL/PLATELET - Abnormal; Notable for the following:    RBC 3.61 (*)    Hemoglobin 11.4 (*)    HCT 33.8 (*)    All other components within normal limits  BASIC METABOLIC PANEL  POC URINE PREG, ED    EKG  EKG Interpretation None       Radiology No results  found.  Procedures Procedures (including critical care time)  Medications Ordered in ED Medications  morphine 4 MG/ML injection 4 mg (4 mg Intravenous Given 04/07/17 2216)  ondansetron (ZOFRAN) injection 4 mg (4 mg Intravenous Given 04/07/17 2216)  ketorolac (TORADOL) 30 MG/ML injection 15 mg (15 mg Intravenous Given 04/07/17 2310)  HYDROmorphone (DILAUDID) injection 0.5 mg (0.5 mg Intravenous Given 04/07/17 2339)     Initial Impression / Assessment and Plan / ED Course  I have reviewed the triage vital signs and the nursing notes.  Pertinent labs & imaging results that were available during my care of the patient were reviewed by  me and considered in my medical decision making (see chart for details).     Pt presents with persistent right flank pain following being diagnosed with 16mm right ureteral stone 4 days ago. Has follow up with urology on Monday. Ran out of pain meds and is uncontrolled with motrin. Serum creatine WNL, vitals sign stable and the pt does not have irratractable vomiting. UA shows no signs of infection. No white count. Spoke with urology who recommends controlling pain and d/c home. Offered pt stent placement. Pt would like to try and control pain at home with lithotripsy scheduled for Monday. Pain as been managed. Doubt andy new etiology of symptoms. Pt will be dc home with pain medications.    Final Clinical Impressions(s) / ED Diagnoses   Final diagnoses:  Ureterolithiasis    New Prescriptions Discharge Medication List as of 04/07/2017 11:58 PM    START taking these medications   Details  HYDROcodone-acetaminophen (NORCO/VICODIN) 5-325 MG tablet Take 2 tablets by mouth every 4 (four) hours as needed., Starting Sat 04/07/2017, Print         Rise MuLeaphart, Dala Breault T, PA-C 04/09/17 1602    Rolan BuccoBelfi, Melanie, MD 04/09/17 [id number]

## 2017-04-14 ENCOUNTER — Encounter (HOSPITAL_COMMUNITY): Payer: Self-pay | Admitting: Oncology

## 2017-04-14 DIAGNOSIS — F17210 Nicotine dependence, cigarettes, uncomplicated: Secondary | ICD-10-CM | POA: Insufficient documentation

## 2017-04-14 DIAGNOSIS — R109 Unspecified abdominal pain: Secondary | ICD-10-CM | POA: Diagnosis present

## 2017-04-14 DIAGNOSIS — N200 Calculus of kidney: Secondary | ICD-10-CM | POA: Diagnosis not present

## 2017-04-14 NOTE — ED Triage Notes (Signed)
Pt c/o right sided flank pain for over a week.  Pt has been seen here and [facility] for the same.  Pt rates pain 10/10, sharp in nature.

## 2017-04-15 ENCOUNTER — Emergency Department (HOSPITAL_COMMUNITY)
Admission: EM | Admit: 2017-04-15 | Discharge: 2017-04-15 | Disposition: A | Discharge status: Home / Self Care | Payer: Managed Care, Other (non HMO) | Attending: Emergency Medicine | Admitting: Emergency Medicine

## 2017-04-15 DIAGNOSIS — N200 Calculus of kidney: Secondary | ICD-10-CM

## 2017-04-15 LAB — URINALYSIS, ROUTINE W REFLEX MICROSCOPIC
Bilirubin Urine: NEGATIVE
GLUCOSE, UA: NEGATIVE mg/dL
KETONES UR: NEGATIVE mg/dL
Nitrite: NEGATIVE
PH: 5 (ref 5.0–8.0)
Protein, ur: NEGATIVE mg/dL
SPECIFIC GRAVITY, URINE: 1.006 (ref 1.005–1.030)

## 2017-04-15 LAB — POC URINE PREG, ED: Preg Test, Ur: NEGATIVE

## 2017-04-15 LAB — I-STAT CHEM 8, ED
BUN: 13 mg/dL (ref 6–20)
CALCIUM ION: 1.14 mmol/L — AB (ref 1.15–1.40)
Chloride: 107 mmol/L (ref 101–111)
Creatinine, Ser: 0.9 mg/dL (ref 0.44–1.00)
Glucose, Bld: 78 mg/dL (ref 65–99)
HCT: 35 % — ABNORMAL LOW (ref 36.0–46.0)
Hemoglobin: 11.9 g/dL — ABNORMAL LOW (ref 12.0–15.0)
Potassium: 3.8 mmol/L (ref 3.5–5.1)
SODIUM: 140 mmol/L (ref 135–145)
TCO2: 24 mmol/L (ref 0–100)

## 2017-04-15 LAB — URINALYSIS, MICROSCOPIC (REFLEX)

## 2017-04-15 MED ORDER — ONDANSETRON HCL 4 MG/2ML IJ SOLN
4.0000 mg | Freq: Once | INTRAMUSCULAR | Status: AC
  Administered 2017-04-15: 4 mg via INTRAVENOUS
  Filled 2017-04-15: qty 2

## 2017-04-15 MED ORDER — HYDROMORPHONE HCL 1 MG/ML IJ SOLN
1.0000 mg | Freq: Once | INTRAMUSCULAR | Status: AC
  Administered 2017-04-15: 1 mg via INTRAVENOUS
  Filled 2017-04-15: qty 1

## 2017-04-15 MED ORDER — KETOROLAC TROMETHAMINE 30 MG/ML IJ SOLN
30.0000 mg | Freq: Once | INTRAMUSCULAR | Status: AC
  Administered 2017-04-15: 30 mg via INTRAVENOUS
  Filled 2017-04-15: qty 1

## 2017-04-15 MED ORDER — OXYCODONE-ACETAMINOPHEN 5-325 MG PO TABS: 1.0000 | ORAL_TABLET | ORAL | 0 refills | Status: DC | PRN

## 2017-04-15 NOTE — ED Provider Notes (Signed)
Patient seen/examined in the Emergency Department in conjunction with Midlevel Provider  Patient reports continued flank/ABD pain due to known kidney stones Exam : awake/alert, appears uncomfortable Plan: plan to check creatinine.  No UTI noted Due to her work schedule she is unable to get procedure accomplished She will call urology in 24 hrs to have procedure scheduled     Zadie RhineWickline, Iyonna Rish, MD 04/15/17 0222

## 2017-04-15 NOTE — Discharge Instructions (Signed)
Take pain medications as prescribed as needed. Please follow up with urology for further treatment

## 2017-04-15 NOTE — ED Provider Notes (Signed)
[facility]-EMERGENCY DEPT Provider Note   CSN: [id number] Arrival date & time: 04/14/17  2320  By signing my name below, I, Megan Frazier, attest that this documentation has been prepared under the direction and in the presence of Megan Wojtaszek, PA-C. Electronically Signed: Diona Frazier, ED Scribe. 04/15/17. 1:03 AM.  History   Chief Complaint Chief Complaint  Patient presents with  . Flank Pain    HPI Megan Frazier is a 36 y.o. female who presents to the Emergency Department complaining of severe pain in her right flank for over a week. Pt diagnosed with 17mm right [facility].  Pt was seen here and [facility] for the same issues. Was given an appointment with a Urologist and was under the impression that they were going to remove the stone at that appointment. At the appointment she was given two options for procedures. A stent or a tube in the back. She opted for the stent. She notes she was unable to rearrange her schedule to return for the procedure. Which contradicted herself when she said that the urologist was unable to preform that particular procedure himself. Pt is upset that she hasn't had stone removed.   The history is provided by the patient. No language interpreter was used.    Past Medical History:  Diagnosis Date  . Chronic back pain   . DDD (degenerative disc disease), lumbar   . Kidney stone   . Migraines   . Sciatica     There are no active problems to display for this patient.   Past Surgical History:  Procedure Laterality Date  . CYSTOSCOPY W/ URETERAL STENT PLACEMENT Right 02/22/2013   Procedure: CYSTOSCOPY WITH RETROGRADE PYELOGRAM/URETERAL STENT PLACEMENT;  Surgeon: Lindaann Slough, MD;  Location: [facility];  Service: Urology;  Laterality: Right;  . HOLMIUM LASER APPLICATION Right 02/22/2013   Procedure: HOLMIUM LASER APPLICATION;  Surgeon: Lindaann Slough, MD;  Location: [facility];  Service: Urology;  Laterality: Right;  . WISDOM TOOTH EXTRACTION      OB History      Gravida Para Term Preterm AB Living   2 2 2  0 0 2   SAB TAB Ectopic Multiple Live Births   0 0 0 0         Home Medications    Prior to Admission medications   Medication Sig Start Date End Date Taking? Authorizing Provider  cyclobenzaprine (FLEXERIL) 10 MG tablet Take 1 tablet (10 mg total) by mouth 2 (two) times daily as needed for muscle spasms. 04/09/16   Danelle Berry, PA-C  HYDROcodone-acetaminophen (NORCO/VICODIN) 5-325 MG tablet Take 2 tablets by mouth every 4 (four) hours as needed. 04/07/17   Rise Mu, PA-C  ibuprofen (ADVIL,MOTRIN) 600 MG tablet Take 1 tablet (600 mg total) by mouth every 6 (six) hours as needed. 04/03/17   Raeford Razor, MD  ibuprofen (ADVIL,MOTRIN) 800 MG tablet Take 1 tablet (800 mg total) by mouth 3 (three) times daily. 04/09/16   Danelle Berry, PA-C  methocarbamol (ROBAXIN) 500 MG tablet Take 1 tablet (500 mg total) by mouth 2 (two) times daily. 03/31/16   Muthersbaugh, Dahlia Client, PA-C  ondansetron (ZOFRAN) 4 MG tablet Take 1 tablet (4 mg total) by mouth every 6 (six) hours. 04/07/17   Rise Mu, PA-C  oxyCODONE-acetaminophen (PERCOCET/ROXICET) 5-325 MG tablet Take 1-2 tablets by mouth every 4 (four) hours as needed for severe pain. 04/03/17   Raeford Razor, MD  predniSONE (DELTASONE) 20 MG tablet Take 2 tablets (40 mg total) by mouth daily.  Take 40 mg by mouth daily for 3 days, then 20mg  by mouth daily for 3 days, then 10mg  daily for 3 days 04/09/16   Danelle Berryapia, [name], PA-C  tamsulosin (FLOMAX) 0.4 MG CAPS capsule Take 1 capsule (0.4 mg total) by mouth daily. 04/03/17   Raeford RazorKohut, Stephen, MD    Family History Family History  Problem Relation Age of Onset  . Other Neg Hx     Social History Social History  Substance Use Topics  . Smoking status: Heavy Tobacco Smoker    Packs/day: 1.00    Types: Cigarettes  . Smokeless tobacco: Never Used  . Alcohol use Yes     Comment: Occas.     Allergies   Patient has no known allergies.   Review of  Systems Review of Systems  Genitourinary: Positive for flank pain.  Psychiatric/Behavioral: Positive for agitation.  All other systems reviewed and are negative.    Physical Exam Updated Vital Signs BP (!) 142/91 (BP Location: Left Arm)   Pulse 82   Temp 98.4 F (36.9 C) (Oral)   Resp 18   Ht 5' (1.524 m)   Wt 105 lb (47.6 kg)   LMP 03/31/2017 (Exact Date)   SpO2 100%   BMI 20.51 kg/m   Physical Exam  Constitutional: She appears well-developed and well-nourished. No distress.  HENT:  Head: Normocephalic and atraumatic.  Eyes: Conjunctivae are normal.  Neck: Normal range of motion.  Cardiovascular: Normal rate.   Pulmonary/Chest: Effort normal.  Abdominal: She exhibits no distension.  Musculoskeletal: Normal range of motion.  Neurological: She is alert.  Skin: No pallor.  Psychiatric: She has a normal mood and affect. Her behavior is normal.  Nursing note and vitals reviewed.  Was not able to complete physical exam due to pt asking me to leave  ED Treatments / Results  DIAGNOSTIC STUDIES: Oxygen Saturation is 100% on RA, normal by my interpretation.   COORDINATION OF CARE: 1:03 AM-Discussed next steps with pt. Pt verbalized understanding and is agreeable with the plan.    Labs (all labs ordered are listed, but only abnormal results are displayed) Labs Reviewed  URINALYSIS, ROUTINE W REFLEX MICROSCOPIC  POC URINE PREG, ED    EKG  EKG Interpretation None       Radiology No results found.  Procedures Procedures (including critical care time)  Medications Ordered in ED Medications - No data to display   Initial Impression / Assessment and Plan / ED Course  I have reviewed the triage vital signs and the nursing notes.  Pertinent labs & imaging results that were available during my care of the patient were reviewed by me and considered in my medical decision making (see chart for details).     During my history taking, pt became visibly upset  with my questions. She stated several times that I "was not listening to her." I explained to her that I was litening and was just trying to get an idea of what urologist's plan was since we cannot see their notes in the computer system. Pt several times stated that she "got a run around" and that she is "tired of dealing with this." She stated that she does not want to rearrange her work schedule for this. When she stopped responding to my questions, her mother stated that she would not want to talk to me any more and asked for another provider who would actually listen. I spoke with Dr. Bebe ShaggyWickline who will come see pt.   Pt seen by  Dr. Bebe Shaggy, plan pain control, check UA, check chem 8, follow up with urology   4:23 AM Pt's pain improved, she is ready for dc home. Renal function and electrolytes normal. UA with no signs of infection. Follow up with urology. Percocet for pain.   Vitals:   04/14/17 2329 04/14/17 2330 04/15/17 0243 04/15/17 0417  BP: (!) 142/91  117/80 119/76  Pulse: 82  77 72  Resp: 18  14 16   Temp: 98.4 F (36.9 C)     TempSrc: Oral     SpO2: 100%  96% 96%  Weight:  105 lb (47.6 kg)    Height:  5' (1.524 m)       Final Clinical Impressions(s) / ED Diagnoses   Final diagnoses:  Nephrolithiasis    New Prescriptions New Prescriptions   OXYCODONE-ACETAMINOPHEN (PERCOCET) 5-325 MG TABLET    Take 1 tablet by mouth every 4 (four) hours as needed for severe pain.  I personally performed the services described in this documentation, which was scribed in my presence. The recorded information has been reviewed and is accurate.     Jaynie Crumble, PA-C 04/15/17 Harley Alto, MD 04/15/17 [phone]

## 2017-04-20 ENCOUNTER — Encounter (HOSPITAL_COMMUNITY): Payer: Self-pay | Admitting: Emergency Medicine

## 2017-04-20 ENCOUNTER — Emergency Department (HOSPITAL_COMMUNITY): Payer: Managed Care, Other (non HMO)

## 2017-04-20 ENCOUNTER — Emergency Department (HOSPITAL_COMMUNITY)
Admission: EM | Admit: 2017-04-20 | Discharge: 2017-04-20 | Disposition: A | Discharge status: Home / Self Care | Payer: Managed Care, Other (non HMO) | Attending: Emergency Medicine | Admitting: Emergency Medicine

## 2017-04-20 DIAGNOSIS — N200 Calculus of kidney: Secondary | ICD-10-CM

## 2017-04-20 DIAGNOSIS — Z7982 Long term (current) use of aspirin: Secondary | ICD-10-CM | POA: Diagnosis not present

## 2017-04-20 DIAGNOSIS — F17210 Nicotine dependence, cigarettes, uncomplicated: Secondary | ICD-10-CM | POA: Insufficient documentation

## 2017-04-20 DIAGNOSIS — K029 Dental caries, unspecified: Secondary | ICD-10-CM | POA: Insufficient documentation

## 2017-04-20 DIAGNOSIS — R109 Unspecified abdominal pain: Secondary | ICD-10-CM | POA: Diagnosis present

## 2017-04-20 LAB — URINALYSIS, ROUTINE W REFLEX MICROSCOPIC
BILIRUBIN URINE: NEGATIVE
Bacteria, UA: NONE SEEN
GLUCOSE, UA: NEGATIVE mg/dL
KETONES UR: NEGATIVE mg/dL
Nitrite: NEGATIVE
PH: 5 (ref 5.0–8.0)
Protein, ur: 100 mg/dL — AB
Specific Gravity, Urine: 1.023 (ref 1.005–1.030)

## 2017-04-20 LAB — CBC
HEMATOCRIT: 40.2 % (ref 36.0–46.0)
HEMOGLOBIN: 13.5 g/dL (ref 12.0–15.0)
MCH: 31.3 pg (ref 26.0–34.0)
MCHC: 33.6 g/dL (ref 30.0–36.0)
MCV: 93.3 fL (ref 78.0–100.0)
Platelets: 259 10*3/uL (ref 150–400)
RBC: 4.31 MIL/uL (ref 3.87–5.11)
RDW: 12.6 % (ref 11.5–15.5)
WBC: 7.3 10*3/uL (ref 4.0–10.5)

## 2017-04-20 LAB — BASIC METABOLIC PANEL
ANION GAP: 6 (ref 5–15)
BUN: 18 mg/dL (ref 6–20)
CHLORIDE: 109 mmol/L (ref 101–111)
CO2: 24 mmol/L (ref 22–32)
Calcium: 9.1 mg/dL (ref 8.9–10.3)
Creatinine, Ser: 0.91 mg/dL (ref 0.44–1.00)
GFR calc non Af Amer: >60 mL/min (ref >60)
GLUCOSE: 93 mg/dL (ref 65–99)
Potassium: 3.9 mmol/L (ref 3.5–5.1)
Sodium: 139 mmol/L (ref 135–145)

## 2017-04-20 LAB — I-STAT BETA HCG BLOOD, ED (MC, WL, AP ONLY): I-stat hCG, quantitative: <5 m[IU]/mL (ref <5)

## 2017-04-20 MED ORDER — ONDANSETRON HCL 4 MG PO TABS: 4.0000 mg | ORAL_TABLET | Freq: Four times a day (QID) | ORAL | 0 refills | Status: DC

## 2017-04-20 MED ORDER — OXYCODONE-ACETAMINOPHEN 5-325 MG PO TABS: 1.0000 | ORAL_TABLET | Freq: Four times a day (QID) | ORAL | 0 refills | Status: DC | PRN

## 2017-04-20 MED ORDER — ONDANSETRON 4 MG PO TBDP
4.0000 mg | ORAL_TABLET | Freq: Once | ORAL | Status: AC
  Administered 2017-04-20: 4 mg via ORAL
  Filled 2017-04-20: qty 1

## 2017-04-20 MED ORDER — KETOROLAC TROMETHAMINE 60 MG/2ML IM SOLN
60.0000 mg | Freq: Once | INTRAMUSCULAR | Status: AC
  Administered 2017-04-20: 60 mg via INTRAMUSCULAR
  Filled 2017-04-20: qty 2

## 2017-04-20 NOTE — ED Provider Notes (Signed)
[facility] Provider Note   CSN: [id number] Arrival date & time: 04/20/17  1140     History   Chief Complaint Chief Complaint  Patient presents with  . Flank Pain    left    HPI Megan Frazier is a 36 y.o. female who presents to the Emergency Department with left flank pain that began this morning. She reports a several weeks h/o of nausea and right-sided flank pain and was seen in the ED on 5/8 and diagnosed with a right 17 mm obstructing kidney stone. She was discharged with follow-up to urology. At the follow-up appointment she was told she would need to schedule an appointment to come back and have a procedure performed. She reports that she can't miss work to have the procedure completed and has been unable to control her pain at home after running out of pain medication. She also complains of diffuse abdominal pain and low back pain (chronic). No urinary retention, fever, chills, hematuria, dysuria.   The history is provided by the patient. No language interpreter was used.    Past Medical History:  Diagnosis Date  . Chronic back pain   . DDD (degenerative disc disease), lumbar   . Kidney stone   . Migraines   . Sciatica     There are no active problems to display for this patient.   Past Surgical History:  Procedure Laterality Date  . CYSTOSCOPY W/ URETERAL STENT PLACEMENT Right 02/22/2013   Procedure: CYSTOSCOPY WITH RETROGRADE PYELOGRAM/URETERAL STENT PLACEMENT;  Surgeon: Lindaann Slough, MD;  Location: [facility];  Service: Urology;  Laterality: Right;  . HOLMIUM LASER APPLICATION Right 02/22/2013   Procedure: HOLMIUM LASER APPLICATION;  Surgeon: Lindaann Slough, MD;  Location: [facility];  Service: Urology;  Laterality: Right;  . WISDOM TOOTH EXTRACTION      OB History    Gravida Para Term Preterm AB Living   2 2 2  0 0 2   SAB TAB Ectopic Multiple Live Births   0 0 0 0         Home Medications    Prior to Admission medications   Medication Sig Start Date  End Date Taking? Authorizing Provider  Aspirin-Salicylamide-Caffeine (BC HEADACHE POWDER PO) Take 1 each by mouth every 6 (six) hours as needed (pain).   Yes [provider]  ondansetron (ZOFRAN) 4 MG tablet Take 1 tablet (4 mg total) by mouth every 6 (six) hours. 04/20/17   McDonald, Mia A, PA-C  oxyCODONE-acetaminophen (PERCOCET/ROXICET) 5-325 MG tablet Take 1 tablet by mouth every 6 (six) hours as needed for severe pain. 04/20/17   McDonald, Coral Else, PA-C    Family History Family History  Problem Relation Age of Onset  . Other Neg Hx     Social History Social History  Substance Use Topics  . Smoking status: Heavy Tobacco Smoker    Packs/day: 1.00    Types: Cigarettes  . Smokeless tobacco: Never Used  . Alcohol use Yes     Comment: Occas.     Allergies   Patient has no known allergies.   Review of Systems Review of Systems  Constitutional: Negative for activity change, chills and fever.  Respiratory: Negative for shortness of breath.   Cardiovascular: Negative for chest pain.  Gastrointestinal: Positive for abdominal pain and nausea. Negative for diarrhea and vomiting.  Genitourinary: Positive for flank pain. Negative for decreased urine volume, dysuria, frequency and hematuria.  Musculoskeletal: Positive for back pain (chronic).  Skin: Negative for rash.  Allergic/Immunologic:  Negative for immunocompromised state.  Neurological: Negative for headaches.  Psychiatric/Behavioral: The patient is nervous/anxious.    Physical Exam Updated Vital Signs BP 105/62 (BP Location: Left Arm)   Pulse (!) 53   Temp 98.1 F (36.7 C) (Oral)   Resp 14   Ht 5' (1.524 m)   Wt 47.6 kg (105 lb)   LMP 03/31/2017 (Exact Date) Comment: neg hCg  SpO2 99%   BMI 20.51 kg/m   Physical Exam  Constitutional: She appears well-developed and well-nourished.  Non-toxic appearance. She does not have a sickly appearance. She does not appear ill. No distress.  Uncomfortable appearing,  writhing in pain.   HENT:  Head: Normocephalic and atraumatic.  Mouth/Throat: Uvula is midline. Abnormal dentition. Dental caries present.  Poor dentition.   Eyes: Conjunctivae are normal.  Neck: Neck supple.  Cardiovascular: Normal rate and regular rhythm.  Exam reveals no gallop and no friction rub.   No murmur heard. Pulmonary/Chest: Effort normal and breath sounds normal. No respiratory distress. She has no wheezes. She has no rales.  Abdominal: Soft. Bowel sounds are normal. She exhibits no distension. There is no tenderness. There is no guarding.  Diffuse, generalized TTP to the entire abdomen.   Musculoskeletal: She exhibits no edema.  Neurological: She is alert.  Skin: Skin is warm and dry. No rash noted. She is not diaphoretic.  Psychiatric: Her behavior is normal. Her mood appears anxious.  Nursing note and vitals reviewed.  ED Treatments / Results  Labs (all labs ordered are listed, but only abnormal results are displayed) Labs Reviewed  URINALYSIS, ROUTINE W REFLEX MICROSCOPIC - Abnormal; Notable for the following:       Result Value   Color, Urine RED (*)    APPearance CLOUDY (*)    Hgb urine dipstick LARGE (*)    Protein, ur 100 (*)    Leukocytes, UA SMALL (*)    Squamous Epithelial / LPF 0-5 (*)    All other components within normal limits  CBC  BASIC METABOLIC PANEL  I-STAT BETA HCG BLOOD, ED ([facility], [facility], [facility] ONLY)  I-STAT CHEM 8, ED    EKG  EKG Interpretation None       Radiology No results found.  Procedures Procedures (including critical care time)  Medications Ordered in ED Medications  ketorolac (TORADOL) injection 60 mg (60 mg Intramuscular Given 04/20/17 1227)  ondansetron (ZOFRAN-ODT) disintegrating tablet 4 mg (4 mg Oral Given 04/20/17 1227)  ondansetron (ZOFRAN-ODT) disintegrating tablet 4 mg (4 mg Oral Given 04/20/17 1535)     Initial Impression / Assessment and Plan / ED Course  I have reviewed the triage vital signs and the nursing  notes.  Pertinent labs & imaging results that were available during my care of the patient were reviewed by me and considered in my medical decision making (see chart for details).     Patient with known history of 17mm right obstructing calculus. The patient has been seen and evaluated in the ED on 5/8, 5/13, 5/20, and today for similar symptoms. A 7077-month prescription history query was performed using the [place] CSRS and medication for pain control has been administered at each visit. During today's visit, Zofran administered for nausea. Toradol administered for pain; significant improvement noted. On recheck, patient is TTP in the left upper and lower quadrants and suprapubic tenderness. Of note, right CVA TTP. No left CVA tenderness. UA with numerous RBCS and large amount of Hgb. History is negative for systemic signs of infection, UTI-symptoms; UA negative  for nitrites and bacteria. No leukocytosis. Low suspicion for urosepsis. Consulted urology who was familiar with the patient; thank you for your continued involvement with this patient's care. Urology recommended d/cing the patient to home with follow-up to urology for outpatient procedures. An extensive amount of patient education was performed with an emphasis on scheduling the appointment to have the procedure performed to avoid sequelae from the obstructing stone. The patient acknowledges understanding. Will d/c to home with a short course of pain medication. VSS stable. NAD.   Final Clinical Impressions(s) / ED Diagnoses   Final diagnoses:  Nephrolithiasis    New Prescriptions Discharge Medication List as of 04/20/2017  3:12 PM       McDonald, Mia A, PA-C 04/23/17 1652    Pricilla Loveless, MD 05/01/17 1452

## 2017-04-20 NOTE — ED Notes (Signed)
Patient ambulated to bathroom with assitance

## 2017-04-20 NOTE — ED Notes (Signed)
ED Provider at bedside. 

## 2017-04-20 NOTE — ED Triage Notes (Signed)
Pt reports left flank pain. Hx kidney stone. sts DX right kidney stone recently. Also reports hematuria and no dysuria. Nausea yet no emesis.

## 2017-04-20 NOTE — Discharge Instructions (Signed)
Please call Dr. Dimas MillinMcKenzie's office on Monday morning to schedule your procedure. Your chances of getting an infection or other problems can increase the longer the stone goes without being removed. Zofran can be used for nausea.

## 2017-04-20 NOTE — ED Notes (Signed)
PT have been made aware of urine sample. Unable to provide at this time 

## 2017-04-27 ENCOUNTER — Other Ambulatory Visit: Payer: Self-pay | Admitting: Urology

## 2017-05-07 ENCOUNTER — Encounter (HOSPITAL_BASED_OUTPATIENT_CLINIC_OR_DEPARTMENT_OTHER): Payer: Self-pay | Admitting: *Deleted

## 2017-05-07 NOTE — Progress Notes (Signed)
NPO AFTER MN W/ EXCEPTION CLEAR LIQUIDS UNTIL 0730 (NO CREAM/ MILKS PRODUCTS).  ARRIVE AT 1145.  CURRENT LAB RESULTS IN CHART AND EPIC.

## 2017-05-14 ENCOUNTER — Encounter (HOSPITAL_BASED_OUTPATIENT_CLINIC_OR_DEPARTMENT_OTHER)
Admission: RE | Disposition: A | Discharge status: Home / Self Care | Payer: Self-pay | Source: Ambulatory Visit | Attending: Urology

## 2017-05-14 ENCOUNTER — Encounter (HOSPITAL_BASED_OUTPATIENT_CLINIC_OR_DEPARTMENT_OTHER): Payer: Self-pay | Admitting: *Deleted

## 2017-05-14 ENCOUNTER — Ambulatory Visit (HOSPITAL_BASED_OUTPATIENT_CLINIC_OR_DEPARTMENT_OTHER): Payer: Managed Care, Other (non HMO) | Admitting: Anesthesiology

## 2017-05-14 ENCOUNTER — Ambulatory Visit (HOSPITAL_BASED_OUTPATIENT_CLINIC_OR_DEPARTMENT_OTHER)
Admission: RE | Admit: 2017-05-14 | Discharge: 2017-05-14 | Disposition: A | Discharge status: Home / Self Care | Payer: Managed Care, Other (non HMO) | Source: Ambulatory Visit | Attending: Urology | Admitting: Urology

## 2017-05-14 DIAGNOSIS — Z7982 Long term (current) use of aspirin: Secondary | ICD-10-CM | POA: Diagnosis not present

## 2017-05-14 DIAGNOSIS — N132 Hydronephrosis with renal and ureteral calculous obstruction: Secondary | ICD-10-CM | POA: Insufficient documentation

## 2017-05-14 DIAGNOSIS — F17210 Nicotine dependence, cigarettes, uncomplicated: Secondary | ICD-10-CM | POA: Insufficient documentation

## 2017-05-14 DIAGNOSIS — G8929 Other chronic pain: Secondary | ICD-10-CM | POA: Diagnosis not present

## 2017-05-14 DIAGNOSIS — M5136 Other intervertebral disc degeneration, lumbar region: Secondary | ICD-10-CM | POA: Diagnosis not present

## 2017-05-14 HISTORY — DX: Calculus of ureter: N20.1

## 2017-05-14 HISTORY — PX: CYSTOSCOPY WITH RETROGRADE PYELOGRAM, URETEROSCOPY AND STENT PLACEMENT: SHX5789

## 2017-05-14 HISTORY — DX: Hematuria, unspecified: R31.9

## 2017-05-14 HISTORY — DX: Calculus of kidney: N20.0

## 2017-05-14 HISTORY — DX: Personal history of urinary calculi: Z87.442

## 2017-05-14 HISTORY — DX: Spondylosis without myelopathy or radiculopathy, lumbar region: M47.816

## 2017-05-14 SURGERY — CYSTOURETEROSCOPY, WITH RETROGRADE PYELOGRAM AND STENT INSERTION
Anesthesia: General | Laterality: Right

## 2017-05-14 MED ORDER — KETOROLAC TROMETHAMINE 30 MG/ML IJ SOLN
INTRAMUSCULAR | Status: DC | PRN
  Administered 2017-05-14: 30 mg via INTRAVENOUS

## 2017-05-14 MED ORDER — OXYBUTYNIN CHLORIDE 5 MG PO TABS: 5.0000 mg | ORAL_TABLET | Freq: Three times a day (TID) | ORAL | 0 refills | Status: DC | PRN

## 2017-05-14 MED ORDER — PROPOFOL 10 MG/ML IV BOLUS
INTRAVENOUS | Status: DC | PRN
Start: 2017-05-14 — End: 2017-05-14
  Administered 2017-05-14: 200 mg via INTRAVENOUS

## 2017-05-14 MED ORDER — TAMSULOSIN HCL 0.4 MG PO CAPS: 0.4000 mg | ORAL_CAPSULE | Freq: Every day | ORAL | 0 refills | Status: DC

## 2017-05-14 MED ORDER — OXYCODONE-ACETAMINOPHEN 10-325 MG PO TABS: 1.0000 | ORAL_TABLET | ORAL | 0 refills | Status: DC | PRN

## 2017-05-14 MED ORDER — PHENAZOPYRIDINE HCL 100 MG PO TABS: 100.0000 mg | ORAL_TABLET | Freq: Three times a day (TID) | ORAL | 0 refills | Status: DC | PRN

## 2017-05-14 MED ORDER — OXYBUTYNIN CHLORIDE 5 MG PO TABS
ORAL_TABLET | ORAL | Status: AC
  Filled 2017-05-14: qty 1

## 2017-05-14 MED ORDER — FENTANYL CITRATE (PF) 100 MCG/2ML IJ SOLN
INTRAMUSCULAR | Status: AC
  Filled 2017-05-14: qty 2

## 2017-05-14 MED ORDER — DEXAMETHASONE SODIUM PHOSPHATE 10 MG/ML IJ SOLN
INTRAMUSCULAR | Status: AC
  Filled 2017-05-14: qty 1

## 2017-05-14 MED ORDER — ONDANSETRON HCL 4 MG/2ML IJ SOLN
INTRAMUSCULAR | Status: DC | PRN
  Administered 2017-05-14: 4 mg via INTRAVENOUS

## 2017-05-14 MED ORDER — ONDANSETRON HCL 4 MG/2ML IJ SOLN
INTRAMUSCULAR | Status: AC
  Filled 2017-05-14: qty 2

## 2017-05-14 MED ORDER — PROPOFOL 10 MG/ML IV BOLUS
INTRAVENOUS | Status: AC
  Filled 2017-05-14: qty 20

## 2017-05-14 MED ORDER — MIDAZOLAM HCL 2 MG/2ML IJ SOLN
INTRAMUSCULAR | Status: AC
  Filled 2017-05-14: qty 2

## 2017-05-14 MED ORDER — OXYBUTYNIN CHLORIDE 5 MG PO TABS
5.0000 mg | ORAL_TABLET | Freq: Three times a day (TID) | ORAL | Status: DC
  Administered 2017-05-14: 5 mg via ORAL
  Filled 2017-05-14: qty 1

## 2017-05-14 MED ORDER — DIAZEPAM 5 MG PO TABS: 5.0000 mg | ORAL_TABLET | Freq: Three times a day (TID) | ORAL | 0 refills | Status: DC | PRN

## 2017-05-14 MED ORDER — OXYCODONE HCL 5 MG PO TABS
5.0000 mg | ORAL_TABLET | Freq: Once | ORAL | Status: AC | PRN
  Administered 2017-05-14: 5 mg via ORAL
  Filled 2017-05-14: qty 1

## 2017-05-14 MED ORDER — LIDOCAINE 2% (20 MG/ML) 5 ML SYRINGE
INTRAMUSCULAR | Status: DC | PRN
  Administered 2017-05-14: 100 mg via INTRAVENOUS

## 2017-05-14 MED ORDER — CEFAZOLIN SODIUM-DEXTROSE 2-4 GM/100ML-% IV SOLN
2.0000 g | INTRAVENOUS | Status: AC
  Administered 2017-05-14: 2 g via INTRAVENOUS
  Filled 2017-05-14: qty 100

## 2017-05-14 MED ORDER — PHENAZOPYRIDINE HCL 100 MG PO TABS
ORAL_TABLET | ORAL | Status: AC
  Filled 2017-05-14: qty 1

## 2017-05-14 MED ORDER — CEFAZOLIN SODIUM-DEXTROSE 2-4 GM/100ML-% IV SOLN
INTRAVENOUS | Status: AC
  Filled 2017-05-14: qty 100

## 2017-05-14 MED ORDER — LACTATED RINGERS IV SOLN
INTRAVENOUS | Status: DC
  Administered 2017-05-14 (×2): via INTRAVENOUS
  Filled 2017-05-14: qty 1000

## 2017-05-14 MED ORDER — OXYCODONE HCL 5 MG PO TABS
ORAL_TABLET | ORAL | Status: AC
  Filled 2017-05-14: qty 1

## 2017-05-14 MED ORDER — DEXAMETHASONE SODIUM PHOSPHATE 4 MG/ML IJ SOLN
INTRAMUSCULAR | Status: DC | PRN
  Administered 2017-05-14: 10 mg via INTRAVENOUS

## 2017-05-14 MED ORDER — FENTANYL CITRATE (PF) 100 MCG/2ML IJ SOLN
INTRAMUSCULAR | Status: DC | PRN
  Administered 2017-05-14 (×2): 50 ug via INTRAVENOUS

## 2017-05-14 MED ORDER — FENTANYL CITRATE (PF) 100 MCG/2ML IJ SOLN
25.0000 ug | INTRAMUSCULAR | Status: DC | PRN
  Administered 2017-05-14: 50 ug via INTRAVENOUS
  Filled 2017-05-14: qty 1

## 2017-05-14 MED ORDER — OXYCODONE HCL 5 MG/5ML PO SOLN
5.0000 mg | Freq: Once | ORAL | Status: AC | PRN
  Filled 2017-05-14: qty 5

## 2017-05-14 MED ORDER — LIDOCAINE 2% (20 MG/ML) 5 ML SYRINGE
INTRAMUSCULAR | Status: AC
Start: 2017-05-14 — End: 2017-05-14
  Filled 2017-05-14: qty 5

## 2017-05-14 MED ORDER — MIDAZOLAM HCL 5 MG/5ML IJ SOLN
INTRAMUSCULAR | Status: DC | PRN
  Administered 2017-05-14: 2 mg via INTRAVENOUS

## 2017-05-14 MED ORDER — PHENAZOPYRIDINE HCL 100 MG PO TABS
100.0000 mg | ORAL_TABLET | Freq: Three times a day (TID) | ORAL | Status: DC
Start: 2017-05-14 — End: 2017-05-14
  Administered 2017-05-14: 100 mg via ORAL
  Filled 2017-05-14: qty 1

## 2017-05-14 MED ORDER — CEFAZOLIN SODIUM-DEXTROSE 1-4 GM/50ML-% IV SOLN
1.0000 g | INTRAVENOUS | Status: DC
  Filled 2017-05-14: qty 50

## 2017-05-14 MED ORDER — KETOROLAC TROMETHAMINE 30 MG/ML IJ SOLN
INTRAMUSCULAR | Status: AC
  Filled 2017-05-14: qty 1

## 2017-05-14 SURGICAL SUPPLY — 32 items
BAG DRAIN URO-CYSTO SKYTR STRL (DRAIN) ×3 IMPLANT
BAG DRN UROCATH (DRAIN) ×1
BASKET DAKOTA 1.9FR 11X120 (BASKET) IMPLANT
BASKET LASER NITINOL 1.9FR (BASKET) IMPLANT
BASKET ZERO TIP NITINOL 2.4FR (BASKET) IMPLANT
BSKT STON RTRVL 120 1.9FR (BASKET)
BSKT STON RTRVL ZERO TP 2.4FR (BASKET)
CATH INTERMIT  6FR 70CM (CATHETERS) IMPLANT
CLOTH BEACON ORANGE TIMEOUT ST (SAFETY) ×3 IMPLANT
EVACUATOR MICROVAS BLADDER (UROLOGICAL SUPPLIES) IMPLANT
FIBER LASER FLEXIVA 1000 (UROLOGICAL SUPPLIES) IMPLANT
FIBER LASER FLEXIVA 200 (UROLOGICAL SUPPLIES) IMPLANT
FIBER LASER FLEXIVA 365 (UROLOGICAL SUPPLIES) IMPLANT
FIBER LASER FLEXIVA 550 (UROLOGICAL SUPPLIES) IMPLANT
FIBER LASER TRAC TIP (UROLOGICAL SUPPLIES) IMPLANT
GLOVE BIO SURGEON STRL SZ8 (GLOVE) ×3 IMPLANT
GOWN STRL REUS W/ TWL LRG LVL3 (GOWN DISPOSABLE) ×1 IMPLANT
GOWN STRL REUS W/ TWL XL LVL3 (GOWN DISPOSABLE) ×1 IMPLANT
GOWN STRL REUS W/TWL LRG LVL3 (GOWN DISPOSABLE) ×3
GOWN STRL REUS W/TWL XL LVL3 (GOWN DISPOSABLE) ×3
GUIDEWIRE ANG ZIPWIRE 038X150 (WIRE) ×3 IMPLANT
GUIDEWIRE STR DUAL SENSOR (WIRE) ×2 IMPLANT
IV NS IRRIG 3000ML ARTHROMATIC (IV SOLUTION) ×3 IMPLANT
KIT RM TURNOVER CYSTO AR (KITS) ×3 IMPLANT
MANIFOLD NEPTUNE II (INSTRUMENTS) ×2 IMPLANT
PACK CYSTO (CUSTOM PROCEDURE TRAY) ×3 IMPLANT
STENT URET 6FRX24 CONTOUR (STENTS) ×2 IMPLANT
STENT URET 6FRX26 CONTOUR (STENTS) IMPLANT
SYRINGE 10CC LL (SYRINGE) ×3 IMPLANT
TUBE CONNECTING 12'X1/4 (SUCTIONS) ×1
TUBE CONNECTING 12X1/4 (SUCTIONS) ×1 IMPLANT
TUBE FEEDING 8FR 16IN STR KANG (MISCELLANEOUS) IMPLANT

## 2017-05-14 NOTE — Transfer of Care (Signed)
Last Vitals:  Vitals:   05/14/17 1147  BP: 116/60  Pulse: 79  Resp: 14  Temp: 37 C    Last Pain:  Vitals:   05/14/17 1147  TempSrc: Oral      Patients Stated Pain Goal: 8 (05/14/17 1218)  Immediate Anesthesia Transfer of Care Note  Patient: Megan Frazier  Procedure(s) Performed: Procedure(s) (LRB): CYSTOSCOPY WITH RETROGRADE PYELOGRAM, URETEROSCOPY AND STENT PLACEMENT (Right)  Patient Location: PACU  Anesthesia Type: General  Level of Consciousness: awake, alert  and oriented  Airway & Oxygen Therapy: Patient Spontanous Breathing and Patient connected to  Nasal cannula oxygen  Post-op Assessment: Report given to PACU RN and Post -op Vital signs reviewed and stable  Post vital signs: Reviewed and stable  Complications: No apparent anesthesia complications

## 2017-05-14 NOTE — Anesthesia Preprocedure Evaluation (Addendum)
Anesthesia Evaluation  Patient identified by MRN, date of birth, ID band Patient awake    Reviewed: Allergy & Precautions, NPO status , Patient's Chart, lab work & pertinent test results  Airway Mallampati: I       Dental  (+) Poor Dentition, Missing, Chipped   Pulmonary Current Smoker,    Pulmonary exam normal        Cardiovascular negative cardio ROS Normal cardiovascular exam     Neuro/Psych negative neurological ROS  negative psych ROS   GI/Hepatic negative GI ROS, Neg liver ROS,   Endo/Other  negative endocrine ROS  Renal/GU Renal disease  negative genitourinary   Musculoskeletal  (+) Arthritis ,   Abdominal Normal abdominal exam  (+)   Peds negative pediatric ROS (+)  Hematology negative hematology ROS (+)   Anesthesia Other Findings   Reproductive/Obstetrics negative OB ROS                            Anesthesia Physical Anesthesia Plan  ASA: II  Anesthesia Plan: General   Post-op Pain Management:    Induction: Intravenous  PONV Risk Score and Plan: 2 and Ondansetron and Dexamethasone  Airway Management Planned: LMA  Additional Equipment:   Intra-op Plan:   Post-operative Plan: Extubation in OR  Informed Consent: I have reviewed the patients History and Physical, chart, labs and discussed the procedure including the risks, benefits and alternatives for the proposed anesthesia with the patient or authorized representative who has indicated his/her understanding and acceptance.   Dental advisory given  Plan Discussed with: CRNA  Anesthesia Plan Comments:         Anesthesia Quick Evaluation

## 2017-05-14 NOTE — Discharge Instructions (Signed)
Ureteral Stent Implantation, Care After °Refer to this sheet in the next few weeks. These instructions provide you with information about caring for yourself after your procedure. Your health care provider Kasper also give you more specific instructions. Your treatment has been planned according to current medical practices, but problems sometimes occur. Call your health care provider if you have any problems or questions after your procedure. °What can I expect after the procedure? °After the procedure, it is common to have: °· Nausea. °· Mild pain when you urinate. You Busler feel this pain in your lower back or lower abdomen. Pain should stop within a few minutes after you urinate. This Reaser last for up to 1 week. °· A small amount of blood in your urine for several days. ° °Follow these instructions at home: ° °Medicines °· Take over-the-counter and prescription medicines only as told by your health care provider. °· If you were prescribed an antibiotic medicine, take it as told by your health care provider. Do not stop taking the antibiotic even if you start to feel better. °· Do not drive for 24 hours if you received a sedative. °· Do not drive or operate heavy machinery while taking prescription pain medicines. °Activity °· Return to your normal activities as told by your health care provider. Ask your health care provider what activities are safe for you. °· Do not lift anything that is heavier than 10 lb (4.5 kg). Follow this limit for 1 week after your procedure, or for as long as told by your health care provider. °General instructions °· Watch for any blood in your urine. Call your health care provider if the amount of blood in your urine increases. °· If you have a catheter: °? Follow instructions from your health care provider about taking care of your catheter and collection bag. °? Do not take baths, swim, or use a hot tub until your health care provider approves. °· Drink enough fluid to keep your urine  clear or pale yellow. °· Keep all follow-up visits as told by your health care provider. This is important. °Contact a health care provider if: °· You have pain that gets worse or does not get better with medicine, especially pain when you urinate. °· You have difficulty urinating. °· You feel nauseous or you vomit repeatedly during a period of more than 2 days after the procedure. °Get help right away if: °· Your urine is dark red or has blood clots in it. °· You are leaking urine (have incontinence). °· The end of the stent comes out of your urethra. °· You cannot urinate. °· You have sudden, sharp, or severe pain in your abdomen or lower back. °· You have a fever. °This information is not intended to replace advice given to you by your health care provider. Make sure you discuss any questions you have with your health care provider. °Document Released: 07/16/2013 Document Revised: 04/20/2016 Document Reviewed: 05/28/2015 °Elsevier Interactive Patient Education © 2018 [employer] ° ° °Post Anesthesia Home Care Instructions ° °Activity: °Get plenty of rest for the remainder of the day. A responsible individual must stay with you for 24 hours following the procedure.  °For the next 24 hours, DO NOT: °-Drive a car °-Operate machinery °-Drink alcoholic beverages °-Take any medication unless instructed by your physician °-Make any legal decisions or sign important papers. ° °Meals: °Start with liquid foods such as gelatin or soup. Progress to regular foods as tolerated. Avoid greasy, spicy, heavy foods. If nausea   and/or vomiting occur, drink only clear liquids until the nausea and/or vomiting subsides. Call your physician if vomiting continues. ° °Special Instructions/Symptoms: °Your throat Vaillancourt feel dry or sore from the anesthesia or the breathing tube placed in your throat during surgery. If this causes discomfort, gargle with warm salt water. The discomfort should disappear within 24 hours. ° °If you had a  scopolamine patch placed behind your ear for the management of post- operative nausea and/or vomiting: ° °1. The medication in the patch is effective for 72 hours, after which it should be removed.  Wrap patch in a tissue and discard in the trash. Wash hands thoroughly with soap and water. °2. You Diveley remove the patch earlier than 72 hours if you experience unpleasant side effects which Miera include dry mouth, dizziness or visual disturbances. °3. Avoid touching the patch. Wash your hands with soap and water after contact with the patch. °  ° °

## 2017-05-14 NOTE — Op Note (Signed)
Preoperative diagnosis: Right hydronephrosis  Postoperative diagnosis: Same  Procedure: 1 cystoscopy 2.  right retrograde pyelography 3.  Intraoperative fluoroscopy, under one hour, with interpretation 4.  Right diagnostic ureteroscopy 5. Right 6x24 JJ ureteral stent  Attending: Cleda MccreedyPatrick Mackenzie  Anesthesia: General  Estimated blood loss: None  Drains: right 6x24 JJ ureteral stent  Specimens: none  Antibiotics: ancef  Findings: 1.7cm lower pole calculus. No hydronephrosis. Narrow mid ureter and we were unable to advanced access sheath.  Indications: Patient is a 36 year old female with a right renal calculus and persistent pain.  After discussing treatment options, she decided proceed with right ureteroscopic stone extraction  Procedure her in detail: The patient was brought to the operating room and a brief timeout was done to ensure correct patient, correct procedure, correct site.  General anesthesia was administered patient was placed in dorsal lithotomy position.  Her genitalia was then prepped and draped in usual sterile fashion.  A rigid 22 French cystoscope was passed in the urethra and the bladder.  Bladder was inspected free masses or lesions.  the right ureteral orifices were in the normal orthotopic locations.  a 6 french ureteral catheter was then instilled into the right ureter orifice.  a gentle retrograde was obtained and findings noted above.  we then placed a zip wire through the ureteral catheter and advanced up to the renal pelvis.  we then removed the cystoscope and cannulated the right ureteral orifice with a semirigid ureteroscope.  we then performed ureteroscopy up to the level of the UPJ. No stone or tumor was encountered. We then advanced a sensor wire in to the renal pelvis. We removed the scope and attempted to pass an access sheath but we could not navigate it past the mid ureter. We then elected to place a stent. We advanced a 6x24 JJ ureteral stent over the  original zip wire and up to the renal pelvis. We removed the wire and good coil was noted in the renal pelvis under fluoroscopy and the bladder under direct vision. the bladder was then drained and this concluded the procedure which was well tolerated by patient.  Complications: None  Condition: Stable, extubated, transferred to PACU  Plan: Pt is to followup in 2 weeks for ureteroscopic stone extraction

## 2017-05-14 NOTE — Anesthesia Procedure Notes (Signed)
Procedure Name: LMA Insertion Date/Time: 05/14/2017 3:45 PM Performed by: Shona SimpsonHOLLIS, KEVIN D Pre-anesthesia Checklist: Patient identified, Emergency Drugs available, Suction available and Patient being monitored Patient Re-evaluated:Patient Re-evaluated prior to inductionOxygen Delivery Method: Circle system utilized Preoxygenation: Pre-oxygenation with 100% oxygen Intubation Type: IV induction Ventilation: Mask ventilation without difficulty LMA: LMA inserted LMA Size: 3.0 Number of attempts: 1 Airway Equipment and Method: Bite block Placement Confirmation: positive ETCO2 Tube secured with: Tape Dental Injury: Teeth and Oropharynx as per pre-operative assessment

## 2017-05-14 NOTE — H&P (Signed)
Urology Admission H&P  Chief Complaint: Right flank pain  History of Present Illness: Megan Frazier is a 35yo with a hx of nephrolithiasis who has a 17mm R UPJ calculus and persistent right flank pain.  Past Medical History:  Diagnosis Date  . Chronic back pain   . DDD (degenerative disc disease), lumbar   . Hematuria   . History of kidney stones   . Lumbar spondylosis   . Nephrolithiasis    left side non-obstructive per ct 04-03-2017  . Ureteropelvic junction calculus    right side   Past Surgical History:  Procedure Laterality Date  . CYSTOSCOPY W/ URETERAL STENT PLACEMENT Right 02/22/2013   Procedure: CYSTOSCOPY WITH RETROGRADE PYELOGRAM/URETERAL STENT PLACEMENT;  Surgeon: Marc-Henry Nesi, MD;  Location: [facility];  Service: Urology;  Laterality: Right;  . HOLMIUM LASER APPLICATION Right 02/22/2013   Procedure: HOLMIUM LASER APPLICATION;  Surgeon: Marc-Henry Nesi, MD;  Location: [facility];  Service: Urology;  Laterality: Right;  . WISDOM TOOTH EXTRACTION      Home Medications:  Prescriptions Prior to Admission  Medication Sig Dispense Refill Last Dose  . acetaminophen (TYLENOL) 500 MG tablet Take 500 mg by mouth every 6 (six) hours as needed.   05/14/2017 at 0900  . Aspirin-Salicylamide-Caffeine (BC HEADACHE POWDER PO) Take 1 each by mouth every 6 (six) hours as needed (pain).   Past Week at Unknown time  . ibuprofen (ADVIL,MOTRIN) 200 MG tablet Take 200 mg by mouth every 6 (six) hours as needed.   Past Week at Unknown time   Allergies:  Allergies  Allergen Reactions  . Latex Swelling    Family History  Problem Relation Age of Onset  . Other Neg Hx    Social History:  reports that she has been smoking Cigarettes.  She has a 16.00 pack-year smoking history. She has never used smokeless tobacco. She reports that she drinks alcohol. She reports that she does not use drugs.  Review of Systems  Genitourinary: Positive for flank pain, frequency and hematuria.  All other systems reviewed  and are negative.   Physical Exam:  Vital signs in last 24 hours: Temp:  [98.6 F (37 C)] 98.6 F (37 C) (06/18 1147) Pulse Rate:  [79] 79 (06/18 1147) Resp:  [14] 14 (06/18 1147) BP: (116)/(60) 116/60 (06/18 1147) SpO2:  [100 %] 100 % (06/18 1147) Weight:  [46.7 kg (103 lb)] 46.7 kg (103 lb) (06/18 1147) Physical Exam  Constitutional: She is oriented to person, place, and time. She appears well-developed and well-nourished.  HENT:  Head: Normocephalic and atraumatic.  Eyes: EOM are normal. Pupils are equal, round, and reactive to light.  Neck: Normal range of motion. No thyromegaly present.  Cardiovascular: Normal rate and regular rhythm.   Respiratory: Effort normal. No respiratory distress.  GI: Soft. She exhibits no distension.  Musculoskeletal: Normal range of motion. She exhibits no edema.  Neurological: She is alert and oriented to person, place, and time.  Skin: Skin is warm and dry.  Psychiatric: She has a normal mood and affect. Her behavior is normal. Judgment and thought content normal.    Laboratory Data:  No results found for this or any previous visit (from the past 24 hour(s)). No results found for this or any previous visit (from the past 240 hour(s)). Creatinine: No results for input(s): CREATININE in the last 168 hours. Baseline Creatinine: unknown  Impression/Assessment:  35yo with right UPJ calculus  Plan:  The risks/benefits/alternatives to R URS was explained to the patient and   she understands and wishes to proceed with surgery  Megan Frazier 05/14/2017, 2:26 PM      

## 2017-05-15 ENCOUNTER — Encounter (HOSPITAL_BASED_OUTPATIENT_CLINIC_OR_DEPARTMENT_OTHER): Payer: Self-pay | Admitting: Urology

## 2017-05-15 NOTE — Anesthesia Postprocedure Evaluation (Signed)
Anesthesia Post Note  Patient: Tye MarylandBarbara G Hemme  Procedure(s) Performed: Procedure(s) (LRB): CYSTOSCOPY WITH RETROGRADE PYELOGRAM, URETEROSCOPY AND STENT PLACEMENT (Right)     Patient location during evaluation: PACU Anesthesia Type: General Level of consciousness: awake and alert Pain management: pain level controlled Vital Signs Assessment: post-procedure vital signs reviewed and stable Respiratory status: spontaneous breathing, nonlabored ventilation, respiratory function stable and patient connected to nasal cannula oxygen Cardiovascular status: blood pressure returned to baseline and stable Postop Assessment: no signs of nausea or vomiting Anesthetic complications: no    Last Vitals:  Vitals:   05/14/17 1715 05/14/17 1730  BP: 109/73 120/68  Pulse: 65 65  Resp: 16 16  Temp:      Last Pain:  Vitals:   05/14/17 1730  TempSrc:   PainSc: 4                  Shelton SilvasKevin D Lyfe Reihl

## 2017-05-17 ENCOUNTER — Other Ambulatory Visit: Payer: Self-pay | Admitting: Urology

## 2017-05-23 ENCOUNTER — Encounter (HOSPITAL_BASED_OUTPATIENT_CLINIC_OR_DEPARTMENT_OTHER): Payer: Self-pay | Admitting: *Deleted

## 2017-05-25 ENCOUNTER — Encounter (HOSPITAL_BASED_OUTPATIENT_CLINIC_OR_DEPARTMENT_OTHER): Payer: Self-pay | Admitting: *Deleted

## 2017-05-25 NOTE — Progress Notes (Signed)
NPO AFTER MN W/ EXCEPTION CLEAR LIQUIDS UNTIL 0830 (NO CREAM/ MILK PRODUCTS).  ARRIVE AT 1245.  NEEDS HG.  Aaron TAKE PAIN/ NAUSEA RX AM DOS W/ SIPS OF WATER.

## 2017-06-04 ENCOUNTER — Encounter (HOSPITAL_BASED_OUTPATIENT_CLINIC_OR_DEPARTMENT_OTHER)
Admission: RE | Disposition: A | Discharge status: Home / Self Care | Payer: Self-pay | Source: Ambulatory Visit | Attending: Urology

## 2017-06-04 ENCOUNTER — Ambulatory Visit (HOSPITAL_BASED_OUTPATIENT_CLINIC_OR_DEPARTMENT_OTHER): Payer: Managed Care, Other (non HMO) | Admitting: Anesthesiology

## 2017-06-04 ENCOUNTER — Encounter (HOSPITAL_BASED_OUTPATIENT_CLINIC_OR_DEPARTMENT_OTHER): Payer: Self-pay | Admitting: *Deleted

## 2017-06-04 ENCOUNTER — Ambulatory Visit (HOSPITAL_BASED_OUTPATIENT_CLINIC_OR_DEPARTMENT_OTHER)
Admission: RE | Admit: 2017-06-04 | Discharge: 2017-06-04 | Disposition: A | Discharge status: Home / Self Care | Payer: Managed Care, Other (non HMO) | Source: Ambulatory Visit | Attending: Urology | Admitting: Urology

## 2017-06-04 DIAGNOSIS — Z79899 Other long term (current) drug therapy: Secondary | ICD-10-CM | POA: Diagnosis not present

## 2017-06-04 DIAGNOSIS — F17210 Nicotine dependence, cigarettes, uncomplicated: Secondary | ICD-10-CM | POA: Insufficient documentation

## 2017-06-04 DIAGNOSIS — N200 Calculus of kidney: Secondary | ICD-10-CM | POA: Diagnosis not present

## 2017-06-04 DIAGNOSIS — F419 Anxiety disorder, unspecified: Secondary | ICD-10-CM | POA: Diagnosis not present

## 2017-06-04 HISTORY — PX: HOLMIUM LASER APPLICATION: SHX5852

## 2017-06-04 HISTORY — PX: CYSTOSCOPY WITH RETROGRADE PYELOGRAM, URETEROSCOPY AND STENT PLACEMENT: SHX5789

## 2017-06-04 LAB — POCT PREGNANCY, URINE: PREG TEST UR: NEGATIVE

## 2017-06-04 LAB — POCT HEMOGLOBIN-HEMACUE: Hemoglobin: 13.1 g/dL (ref 12.0–15.0)

## 2017-06-04 SURGERY — CYSTOURETEROSCOPY, WITH RETROGRADE PYELOGRAM AND STENT INSERTION
Anesthesia: General | Laterality: Right

## 2017-06-04 MED ORDER — DIAZEPAM 5 MG PO TABS: 5.0000 mg | ORAL_TABLET | Freq: Three times a day (TID) | ORAL | 0 refills | Status: DC | PRN

## 2017-06-04 MED ORDER — PROPOFOL 10 MG/ML IV BOLUS
INTRAVENOUS | Status: AC
  Filled 2017-06-04: qty 40

## 2017-06-04 MED ORDER — PROPOFOL 10 MG/ML IV BOLUS
INTRAVENOUS | Status: DC | PRN
  Administered 2017-06-04: 130 mg via INTRAVENOUS

## 2017-06-04 MED ORDER — OXYCODONE HCL 5 MG PO TABS
ORAL_TABLET | ORAL | Status: AC
  Filled 2017-06-04: qty 1

## 2017-06-04 MED ORDER — LIDOCAINE 2% (20 MG/ML) 5 ML SYRINGE
INTRAMUSCULAR | Status: DC | PRN
  Administered 2017-06-04: 40 mg via INTRAVENOUS

## 2017-06-04 MED ORDER — CEFAZOLIN SODIUM-DEXTROSE 2-4 GM/100ML-% IV SOLN
INTRAVENOUS | Status: AC
  Filled 2017-06-04: qty 100

## 2017-06-04 MED ORDER — MIDAZOLAM HCL 5 MG/5ML IJ SOLN
INTRAMUSCULAR | Status: DC | PRN
  Administered 2017-06-04: 2 mg via INTRAVENOUS

## 2017-06-04 MED ORDER — FENTANYL CITRATE (PF) 100 MCG/2ML IJ SOLN
25.0000 ug | INTRAMUSCULAR | Status: DC | PRN
  Administered 2017-06-04: 50 ug via INTRAVENOUS
  Filled 2017-06-04: qty 1

## 2017-06-04 MED ORDER — FENTANYL CITRATE (PF) 100 MCG/2ML IJ SOLN
INTRAMUSCULAR | Status: AC
  Filled 2017-06-04: qty 2

## 2017-06-04 MED ORDER — DEXAMETHASONE SODIUM PHOSPHATE 4 MG/ML IJ SOLN
INTRAMUSCULAR | Status: DC | PRN
  Administered 2017-06-04: 10 mg via INTRAVENOUS

## 2017-06-04 MED ORDER — PHENAZOPYRIDINE HCL 100 MG PO TABS: 100.0000 mg | ORAL_TABLET | Freq: Three times a day (TID) | ORAL | 0 refills | Status: DC | PRN

## 2017-06-04 MED ORDER — ONDANSETRON HCL 4 MG/2ML IJ SOLN
INTRAMUSCULAR | Status: DC | PRN
  Administered 2017-06-04: 4 mg via INTRAVENOUS

## 2017-06-04 MED ORDER — ONDANSETRON HCL 4 MG/2ML IJ SOLN
4.0000 mg | Freq: Four times a day (QID) | INTRAMUSCULAR | Status: DC | PRN
  Filled 2017-06-04: qty 2

## 2017-06-04 MED ORDER — MIDAZOLAM HCL 2 MG/2ML IJ SOLN
INTRAMUSCULAR | Status: AC
  Filled 2017-06-04: qty 2

## 2017-06-04 MED ORDER — OXYCODONE-ACETAMINOPHEN 10-325 MG PO TABS: 1.0000 | ORAL_TABLET | ORAL | 0 refills | Status: DC | PRN

## 2017-06-04 MED ORDER — CEFAZOLIN SODIUM-DEXTROSE 2-4 GM/100ML-% IV SOLN
2.0000 g | INTRAVENOUS | Status: AC
  Administered 2017-06-04: 2 g via INTRAVENOUS
  Filled 2017-06-04: qty 100

## 2017-06-04 MED ORDER — TAMSULOSIN HCL 0.4 MG PO CAPS: 0.4000 mg | ORAL_CAPSULE | Freq: Every day | ORAL | 0 refills | Status: DC

## 2017-06-04 MED ORDER — FENTANYL CITRATE (PF) 100 MCG/2ML IJ SOLN
INTRAMUSCULAR | Status: DC | PRN
  Administered 2017-06-04 (×2): 50 ug via INTRAVENOUS

## 2017-06-04 MED ORDER — OXYCODONE HCL 5 MG/5ML PO SOLN
5.0000 mg | Freq: Once | ORAL | Status: AC | PRN
  Filled 2017-06-04: qty 5

## 2017-06-04 MED ORDER — LACTATED RINGERS IV SOLN
INTRAVENOUS | Status: DC
  Administered 2017-06-04: 13:00:00 via INTRAVENOUS
  Filled 2017-06-04: qty 1000

## 2017-06-04 MED ORDER — OXYCODONE HCL 5 MG PO TABS
5.0000 mg | ORAL_TABLET | Freq: Once | ORAL | Status: AC | PRN
  Administered 2017-06-04: 5 mg via ORAL
  Filled 2017-06-04: qty 1

## 2017-06-04 MED ORDER — KETOROLAC TROMETHAMINE 30 MG/ML IJ SOLN
INTRAMUSCULAR | Status: DC | PRN
  Administered 2017-06-04: 30 mg via INTRAVENOUS

## 2017-06-04 SURGICAL SUPPLY — 35 items
BAG DRAIN URO-CYSTO SKYTR STRL (DRAIN) ×2 IMPLANT
BAG DRN UROCATH (DRAIN) ×1
BASKET DAKOTA 1.9FR 11X120 (BASKET) IMPLANT
BASKET LASER NITINOL 1.9FR (BASKET) IMPLANT
BASKET ZERO TIP NITINOL 2.4FR (BASKET) IMPLANT
BSKT STON RTRVL 120 1.9FR (BASKET)
BSKT STON RTRVL ZERO TP 2.4FR (BASKET)
CATH INTERMIT  6FR 70CM (CATHETERS) IMPLANT
CLOTH BEACON ORANGE TIMEOUT ST (SAFETY) ×2 IMPLANT
EVACUATOR MICROVAS BLADDER (UROLOGICAL SUPPLIES) IMPLANT
EXTRACTOR STONE NITINOL NGAGE (UROLOGICAL SUPPLIES) ×1 IMPLANT
FIBER LASER FLEXIVA 1000 (UROLOGICAL SUPPLIES) IMPLANT
FIBER LASER FLEXIVA 200 (UROLOGICAL SUPPLIES) ×1 IMPLANT
FIBER LASER FLEXIVA 365 (UROLOGICAL SUPPLIES) IMPLANT
FIBER LASER FLEXIVA 550 (UROLOGICAL SUPPLIES) IMPLANT
FIBER LASER TRAC TIP (UROLOGICAL SUPPLIES) ×1 IMPLANT
GLOVE BIO SURGEON STRL SZ8 (GLOVE) ×1 IMPLANT
GLOVE BIOGEL PI IND STRL 8 (GLOVE) IMPLANT
GLOVE BIOGEL PI INDICATOR 8 (GLOVE) ×1
GOWN STRL REUS W/ TWL LRG LVL3 (GOWN DISPOSABLE) ×1 IMPLANT
GOWN STRL REUS W/ TWL XL LVL3 (GOWN DISPOSABLE) ×1 IMPLANT
GOWN STRL REUS W/TWL LRG LVL3 (GOWN DISPOSABLE) ×2
GOWN STRL REUS W/TWL XL LVL3 (GOWN DISPOSABLE) ×2
GUIDEWIRE ANG ZIPWIRE 038X150 (WIRE) ×2 IMPLANT
GUIDEWIRE STR DUAL SENSOR (WIRE) IMPLANT
IV NS IRRIG 3000ML ARTHROMATIC (IV SOLUTION) ×2 IMPLANT
KIT RM TURNOVER CYSTO AR (KITS) ×2 IMPLANT
MANIFOLD NEPTUNE II (INSTRUMENTS) ×1 IMPLANT
PACK CYSTO (CUSTOM PROCEDURE TRAY) ×2 IMPLANT
SHEATH ACCESS URETERAL 38CM (SHEATH) ×1 IMPLANT
STENT URET 6FRX24 CONTOUR (STENTS) ×1 IMPLANT
STENT URET 6FRX26 CONTOUR (STENTS) IMPLANT
SYRINGE 10CC LL (SYRINGE) ×2 IMPLANT
TUBE CONNECTING 12X1/4 (SUCTIONS) IMPLANT
TUBE FEEDING 8FR 16IN STR KANG (MISCELLANEOUS) IMPLANT

## 2017-06-04 NOTE — Interval H&P Note (Signed)
History and Physical Interval Note:  06/04/2017 2:12 PM  Megan Frazier  has presented today for surgery, with the diagnosis of RIGHT RENAL CALCULUS  The various methods of treatment have been discussed with the patient and family. After consideration of risks, benefits and other options for treatment, the patient has consented to  Procedure(s): CYSTOSCOPY WITH RETROGRADE PYELOGRAM, URETEROSCOPY AND STENT REPLACEMENT (Right) HOLMIUM LASER APPLICATION (Right) as a surgical intervention .  The patient's history has been reviewed, patient examined, no change in status, stable for surgery.  I have reviewed the patient's chart and labs.  Questions were answered to the patient's satisfaction.     Wilkie AyePatrick Audreena Sachdeva

## 2017-06-04 NOTE — Anesthesia Postprocedure Evaluation (Signed)
Anesthesia Post Note  Patient: Megan Frazier  Procedure(s) Performed: Procedure(s) (LRB): CYSTOSCOPY WITH RETROGRADE PYELOGRAM, URETEROSCOPY AND STENT REPLACEMENT (Right) HOLMIUM LASER APPLICATION (Right)     Patient location during evaluation: PACU Anesthesia Type: General Level of consciousness: awake and alert Pain management: pain level controlled Vital Signs Assessment: post-procedure vital signs reviewed and stable Respiratory status: spontaneous breathing, nonlabored ventilation, respiratory function stable and patient connected to nasal cannula oxygen Cardiovascular status: blood pressure returned to baseline and stable Postop Assessment: no signs of nausea or vomiting Anesthetic complications: no    Last Vitals:  Vitals:   06/04/17 1253 06/04/17 1602  BP: 103/64 109/71  Pulse: 65 89  Resp: 14 12  Temp: 36.8 C 36.8 C    Last Pain:  Vitals:   06/04/17 1253  TempSrc: Oral                 Spring San

## 2017-06-04 NOTE — Anesthesia Preprocedure Evaluation (Addendum)
Anesthesia Evaluation  Patient identified by MRN, date of birth, ID band Patient awake    Reviewed: Allergy & Precautions, H&P , NPO status , Patient's Chart, lab work & pertinent test results  Airway Mallampati: II  TM Distance: >3 FB Neck ROM: full    Dental  (+) Dental Advisory Given, Poor Dentition,    Pulmonary Current Smoker,    breath sounds clear to auscultation       Cardiovascular negative cardio ROS   Rhythm:regular Rate:Normal     Neuro/Psych  Headaches, Anxiety Chronic back pain. Pt takes Percocet "several" per day.    GI/Hepatic   Endo/Other    Renal/GU Renal diseasestones     Musculoskeletal  (+) Arthritis ,   Abdominal   Peds  Hematology   Anesthesia Other Findings   Reproductive/Obstetrics                           Anesthesia Physical Anesthesia Plan  ASA: II  Anesthesia Plan: General   Post-op Pain Management:    Induction: Intravenous  PONV Risk Score and Plan: 3 and Ondansetron, Dexamethasone, Propofol, Midazolam and Treatment Burack vary due to age or medical condition  Airway Management Planned: LMA  Additional Equipment:   Intra-op Plan:   Post-operative Plan:   Informed Consent: I have reviewed the patients History and Physical, chart, labs and discussed the procedure including the risks, benefits and alternatives for the proposed anesthesia with the patient or authorized representative who has indicated his/her understanding and acceptance.     Plan Discussed with: CRNA, Anesthesiologist and Surgeon  Anesthesia Plan Comments:         Anesthesia Quick Evaluation

## 2017-06-04 NOTE — Brief Op Note (Signed)
06/04/2017  3:51 PM  PATIENT:  Megan Frazier  35 y.o. female  PRE-OPERATIVE DIAGNOSIS:  RIGHT RENAL CALCULUS  POST-OPERATIVE DIAGNOSIS:  RIGHT RENAL CALCULUS  PROCEDURE:  Procedure(s): CYSTOSCOPY WITH RETROGRADE PYELOGRAM, URETEROSCOPY AND STENT REPLACEMENT (Right) HOLMIUM LASER APPLICATION (Right)  SURGEON:  Surgeon(s) and Role:    * Dandra Shambaugh, Mardene CelestePatrick L, MD - Primary  PHYSICIAN ASSISTANT:   ASSISTANTS: none   ANESTHESIA:   general  EBL:  Total I/O In: 550 [I.V.:550] Out: 5 [Blood:5]  BLOOD ADMINISTERED:none  DRAINS: right 6x24 JJ ureteral stent   LOCAL MEDICATIONS USED:  NONE  SPECIMEN:  Source of Specimen:  right renal calculus  DISPOSITION OF SPECIMEN:  N/A  COUNTS:  YES  TOURNIQUET:  * No tourniquets in log *  DICTATION: .Note written in EPIC  PLAN OF CARE: Discharge to home after PACU  PATIENT DISPOSITION:  PACU - hemodynamically stable.   Delay start of Pharmacological VTE agent (>24hrs) due to surgical blood loss or risk of bleeding: not applicable

## 2017-06-04 NOTE — Transfer of Care (Signed)
Immediate Anesthesia Transfer of Care Note  Patient: Tye MarylandBarbara G Zabawa  Procedure(s) Performed: Procedure(s): CYSTOSCOPY WITH RETROGRADE PYELOGRAM, URETEROSCOPY AND STENT REPLACEMENT (Right) HOLMIUM LASER APPLICATION (Right)  Patient Location: PACU  Anesthesia Type:General  Level of Consciousness: awake, alert , oriented and patient cooperative  Airway & Oxygen Therapy: Patient Spontanous Breathing and Patient connected to nasal cannula oxygen  Post-op Assessment: Report given to RN and Post -op Vital signs reviewed and stable  Post vital signs: Reviewed and stable  Last Vitals:  Vitals:   06/04/17 1253  BP: 103/64  Pulse: 65  Resp: 14  Temp: 36.8 C    Last Pain:  Vitals:   06/04/17 1253  TempSrc: Oral      Patients Stated Pain Goal: 8 (06/04/17 1339)  Complications: No apparent anesthesia complications

## 2017-06-04 NOTE — Discharge Instructions (Signed)
Ureteral Stent Implantation, Care After °Refer to this sheet in the next few weeks. These instructions provide you with information about caring for yourself after your procedure. Your health care provider Megan Frazier also give you more specific instructions. Your treatment has been planned according to current medical practices, but problems sometimes occur. Call your health care provider if you have any problems or questions after your procedure. °What can I expect after the procedure? °After the procedure, it is common to have: °· Nausea. °· Mild pain when you urinate. You Megan Frazier feel this pain in your lower back or lower abdomen. Pain should stop within a few minutes after you urinate. This Megan Frazier last for up to 1 week. °· A small amount of blood in your urine for several days. ° °Follow these instructions at home: ° °Medicines °· Take over-the-counter and prescription medicines only as told by your health care provider. °· If you were prescribed an antibiotic medicine, take it as told by your health care provider. Do not stop taking the antibiotic even if you start to feel better. °· Do not drive for 24 hours if you received a sedative. °· Do not drive or operate heavy machinery while taking prescription pain medicines. °Activity °· Return to your normal activities as told by your health care provider. Ask your health care provider what activities are safe for you. °· Do not lift anything that is heavier than 10 lb (4.5 kg). Follow this limit for 1 week after your procedure, or for as long as told by your health care provider. °General instructions °· Watch for any blood in your urine. Call your health care provider if the amount of blood in your urine increases. °· If you have a catheter: °? Follow instructions from your health care provider about taking care of your catheter and collection bag. °? Do not take baths, swim, or use a hot tub until your health care provider approves. °· Drink enough fluid to keep your urine  clear or pale yellow. °· Keep all follow-up visits as told by your health care provider. This is important. °Contact a health care provider if: °· You have pain that gets worse or does not get better with medicine, especially pain when you urinate. °· You have difficulty urinating. °· You feel nauseous or you vomit repeatedly during a period of more than 2 days after the procedure. °Get help right away if: °· Your urine is dark red or has blood clots in it. °· You are leaking urine (have incontinence). °· The end of the stent comes out of your urethra. °· You cannot urinate. °· You have sudden, sharp, or severe pain in your abdomen or lower back. °· You have a fever. °This information is not intended to replace advice given to you by your health care provider. Make sure you discuss any questions you have with your health care provider. °Document Released: 07/16/2013 Document Revised: 04/20/2016 Document Reviewed: 05/28/2015 °Elsevier Interactive Patient Education © 2018 [employer] ° ° °Post Anesthesia Home Care Instructions ° °Activity: °Get plenty of rest for the remainder of the day. A responsible individual must stay with you for 24 hours following the procedure.  °For the next 24 hours, DO NOT: °-Drive a car °-Operate machinery °-Drink alcoholic beverages °-Take any medication unless instructed by your physician °-Make any legal decisions or sign important papers. ° °Meals: °Start with liquid foods such as gelatin or soup. Progress to regular foods as tolerated. Avoid greasy, spicy, heavy foods. If nausea   and/or vomiting occur, drink only clear liquids until the nausea and/or vomiting subsides. Call your physician if vomiting continues. ° °Special Instructions/Symptoms: °Your throat Megan Frazier feel dry or sore from the anesthesia or the breathing tube placed in your throat during surgery. If this causes discomfort, gargle with warm salt water. The discomfort should disappear within 24 hours. ° °If you had a  scopolamine patch placed behind your ear for the management of post- operative nausea and/or vomiting: ° °1. The medication in the patch is effective for 72 hours, after which it should be removed.  Wrap patch in a tissue and discard in the trash. Wash hands thoroughly with soap and water. °2. You Megan Frazier remove the patch earlier than 72 hours if you experience unpleasant side effects which Megan Frazier include dry mouth, dizziness or visual disturbances. °3. Avoid touching the patch. Wash your hands with soap and water after contact with the patch. °  ° °

## 2017-06-04 NOTE — H&P (View-Only) (Signed)
Urology Admission H&P  Chief Complaint: Right flank pain  History of Present Illness: Ms Benton is a 35yo with a hx of nephrolithiasis who has a 17mm R UPJ calculus and persistent right flank pain.  Past Medical History:  Diagnosis Date  . Chronic back pain   . DDD (degenerative disc disease), lumbar   . Hematuria   . History of kidney stones   . Lumbar spondylosis   . Nephrolithiasis    left side non-obstructive per ct 04-03-2017  . Ureteropelvic junction calculus    right side   Past Surgical History:  Procedure Laterality Date  . CYSTOSCOPY W/ URETERAL STENT PLACEMENT Right 02/22/2013   Procedure: CYSTOSCOPY WITH RETROGRADE PYELOGRAM/URETERAL STENT PLACEMENT;  Surgeon: Lindaann SloughMarc-Henry Nesi, MD;  Location: [facility];  Service: Urology;  Laterality: Right;  . HOLMIUM LASER APPLICATION Right 02/22/2013   Procedure: HOLMIUM LASER APPLICATION;  Surgeon: Lindaann SloughMarc-Henry Nesi, MD;  Location: [facility];  Service: Urology;  Laterality: Right;  . WISDOM TOOTH EXTRACTION      Home Medications:  Prescriptions Prior to Admission  Medication Sig Dispense Refill Last Dose  . acetaminophen (TYLENOL) 500 MG tablet Take 500 mg by mouth every 6 (six) hours as needed.   05/14/2017 at 0900  . Aspirin-Salicylamide-Caffeine (BC HEADACHE POWDER PO) Take 1 each by mouth every 6 (six) hours as needed (pain).   Past Week at Unknown time  . ibuprofen (ADVIL,MOTRIN) 200 MG tablet Take 200 mg by mouth every 6 (six) hours as needed.   Past Week at Unknown time   Allergies:  Allergies  Allergen Reactions  . Latex Swelling    Family History  Problem Relation Age of Onset  . Other Neg Hx    Social History:  reports that she has been smoking Cigarettes.  She has a 16.00 pack-year smoking history. She has never used smokeless tobacco. She reports that she drinks alcohol. She reports that she does not use drugs.  Review of Systems  Genitourinary: Positive for flank pain, frequency and hematuria.  All other systems reviewed  and are negative.   Physical Exam:  Vital signs in last 24 hours: Temp:  [98.6 F (37 C)] 98.6 F (37 C) (06/18 1147) Pulse Rate:  [79] 79 (06/18 1147) Resp:  [14] 14 (06/18 1147) BP: (116)/(60) 116/60 (06/18 1147) SpO2:  [100 %] 100 % (06/18 1147) Weight:  [46.7 kg (103 lb)] 46.7 kg (103 lb) (06/18 1147) Physical Exam  Constitutional: She is oriented to person, place, and time. She appears well-developed and well-nourished.  HENT:  Head: Normocephalic and atraumatic.  Eyes: EOM are normal. Pupils are equal, round, and reactive to light.  Neck: Normal range of motion. No thyromegaly present.  Cardiovascular: Normal rate and regular rhythm.   Respiratory: Effort normal. No respiratory distress.  GI: Soft. She exhibits no distension.  Musculoskeletal: Normal range of motion. She exhibits no edema.  Neurological: She is alert and oriented to person, place, and time.  Skin: Skin is warm and dry.  Psychiatric: She has a normal mood and affect. Her behavior is normal. Judgment and thought content normal.    Laboratory Data:  No results found for this or any previous visit (from the past 24 hour(s)). No results found for this or any previous visit (from the past 240 hour(s)). Creatinine: No results for input(s): CREATININE in the last 168 hours. Baseline Creatinine: unknown  Impression/Assessment:  35yo with right UPJ calculus  Plan:  The risks/benefits/alternatives to R URS was explained to the patient and  she understands and wishes to proceed with surgery  [name] 05/14/2017, 2:26 PM

## 2017-06-04 NOTE — Anesthesia Procedure Notes (Signed)
Procedure Name: LMA Insertion Date/Time: 06/04/2017 3:12 PM Performed by: Tyrone NineSAUVE, Celvin Taney F Pre-anesthesia Checklist: Patient identified, Timeout performed, Emergency Drugs available, Suction available and Patient being monitored Patient Re-evaluated:Patient Re-evaluated prior to inductionOxygen Delivery Method: Circle system utilized Preoxygenation: Pre-oxygenation with 100% oxygen Intubation Type: IV induction Ventilation: Mask ventilation without difficulty LMA: LMA inserted LMA Size: 3.0 Number of attempts: 1 Placement Confirmation: breath sounds checked- equal and bilateral,  CO2 detector and positive ETCO2 Tube secured with: Tape Dental Injury: Teeth and Oropharynx as per pre-operative assessment

## 2017-06-05 ENCOUNTER — Encounter (HOSPITAL_BASED_OUTPATIENT_CLINIC_OR_DEPARTMENT_OTHER): Payer: Self-pay | Admitting: Urology

## 2017-06-17 NOTE — Op Note (Signed)
.  Preoperative diagnosis: right renal calculus  Postoperative diagnosis: Same  Procedure: 1 cystoscopy 2. Right retrograde pyelography 3.  Intraoperative fluoroscopy, under one hour, with interpretation 4.  right ureteroscopic stone manipulation with laser lithotripsy 5.  right 6 x 24 JJ stent exchange  Attending: Wilkie AyePatrick Nunzio Banet  Anesthesia: General  Estimated blood loss: None  Drains: right 6 x 26 JJ ureteral stent without tether  Specimens: stone for analysis  Antibiotics: rocephin  Findings: right lower pole renal calculus. No hydronephrosis. No masses/lesions in the bladder. Ureteral orifices in normal anatomic location.  Indications: Patient is a 36 year old female with a history of a large right renal calculi and underwent ureteral stent placement. After discussing treatment options, they decided proceed with right ureteroscopic stone manipulation.  Procedure her in detail: The patient was brought to the operating room and a brief timeout was done to ensure correct patient, correct procedure, correct site.  General anesthesia was administered patient was placed in dorsal lithotomy position.  Her genitalia was then prepped and draped in usual sterile fashion.  A rigid 22 French cystoscope was passed in the urethra and the bladder.  Bladder was inspected free masses or lesions.  the ureteral orifices were in the normal orthotopic locations.  Using a grasper the right ureteral stent was brought to the urethral meatus. A zipwire was then advanced throught the stent and up to the renal pelvis. The stent was then removed. We then cannulated the right ureteral orifice with a 6 french ureteral catheter. A gentle retrograde was obtained and the finding are noted above. we then removed the cystoscope and cannulated the left ureteral orifice with a semirigid ureteroscope.  We located no stone in the ureter. We then placed a sensor wire up to the renal pelvis. We removed the scope and advanced a  12/14 x 38cm access sheath up to the renal pelvis. We then used the flexible ureteroscope to perform nephroscopy. We located the calculus in the lower pole. Using a 200nm laser fiber the stone was fragmented. Multiple small fragments were then removed with an NGage basket. The remaining fragments were dusted until no fragment over 1mm remained. Once the stone were removed we then removed the access sheath under direct vision and noted to injury to the ureter. we then placed a 6 x 24 double-j ureteral stent over the original zip wire.  We then removed the wire and good coil was noted in the the renal pelvis under fluoroscopy and the bladder under direct vision.   the bladder was then drained and this concluded the procedure which was well tolerated by patient.  Complications: None  Condition: Stable, extubated, transferred to PACU  Plan: Patient is to be discharged home as to follow-up in 1 week for stent removal

## 2017-07-04 ENCOUNTER — Emergency Department (HOSPITAL_COMMUNITY): Payer: Managed Care, Other (non HMO)

## 2017-07-04 ENCOUNTER — Emergency Department (HOSPITAL_COMMUNITY)
Admission: EM | Admit: 2017-07-04 | Discharge: 2017-07-04 | Disposition: A | Discharge status: Home / Self Care | Payer: Managed Care, Other (non HMO) | Attending: Emergency Medicine | Admitting: Emergency Medicine

## 2017-07-04 ENCOUNTER — Encounter (HOSPITAL_COMMUNITY): Payer: Self-pay

## 2017-07-04 DIAGNOSIS — R1032 Left lower quadrant pain: Secondary | ICD-10-CM | POA: Insufficient documentation

## 2017-07-04 DIAGNOSIS — R109 Unspecified abdominal pain: Secondary | ICD-10-CM

## 2017-07-04 DIAGNOSIS — Z91040 Latex allergy status: Secondary | ICD-10-CM | POA: Diagnosis not present

## 2017-07-04 DIAGNOSIS — R319 Hematuria, unspecified: Secondary | ICD-10-CM | POA: Diagnosis not present

## 2017-07-04 DIAGNOSIS — F17210 Nicotine dependence, cigarettes, uncomplicated: Secondary | ICD-10-CM | POA: Insufficient documentation

## 2017-07-04 LAB — URINALYSIS, ROUTINE W REFLEX MICROSCOPIC
Bilirubin Urine: NEGATIVE
Glucose, UA: NEGATIVE mg/dL
Ketones, ur: NEGATIVE mg/dL
Leukocytes, UA: NEGATIVE
Nitrite: NEGATIVE
PROTEIN: NEGATIVE mg/dL
Specific Gravity, Urine: 1.017 (ref 1.005–1.030)
pH: 5 (ref 5.0–8.0)

## 2017-07-04 LAB — I-STAT CHEM 8, ED
BUN: 23 mg/dL — ABNORMAL HIGH (ref 6–20)
CHLORIDE: 109 mmol/L (ref 101–111)
CREATININE: 1.1 mg/dL — AB (ref 0.44–1.00)
Calcium, Ion: 1.18 mmol/L (ref 1.15–1.40)
GLUCOSE: 88 mg/dL (ref 65–99)
HCT: 37 % (ref 36.0–46.0)
HEMOGLOBIN: 12.6 g/dL (ref 12.0–15.0)
POTASSIUM: 3.8 mmol/L (ref 3.5–5.1)
Sodium: 140 mmol/L (ref 135–145)
TCO2: 23 mmol/L (ref 0–100)

## 2017-07-04 LAB — PREGNANCY, URINE: Preg Test, Ur: NEGATIVE

## 2017-07-04 MED ORDER — OXYCODONE-ACETAMINOPHEN 10-325 MG PO TABS: 1.0000 | ORAL_TABLET | Freq: Four times a day (QID) | ORAL | 0 refills | Status: DC | PRN

## 2017-07-04 MED ORDER — KETOROLAC TROMETHAMINE 30 MG/ML IJ SOLN
30.0000 mg | Freq: Once | INTRAMUSCULAR | Status: AC
  Administered 2017-07-04: 30 mg via INTRAVENOUS
  Filled 2017-07-04: qty 1

## 2017-07-04 MED ORDER — HYDROMORPHONE HCL 1 MG/ML IJ SOLN
0.5000 mg | Freq: Once | INTRAMUSCULAR | Status: AC
  Administered 2017-07-04: 0.5 mg via INTRAVENOUS
  Filled 2017-07-04: qty 1

## 2017-07-04 MED ORDER — ONDANSETRON HCL 4 MG/2ML IJ SOLN
4.0000 mg | Freq: Once | INTRAMUSCULAR | Status: AC
  Administered 2017-07-04: 4 mg via INTRAVENOUS
  Filled 2017-07-04: qty 2

## 2017-07-04 MED ORDER — OXYCODONE-ACETAMINOPHEN 5-325 MG PO TABS
2.0000 | ORAL_TABLET | Freq: Once | ORAL | Status: AC
  Administered 2017-07-04: 2 via ORAL
  Filled 2017-07-04: qty 2

## 2017-07-04 MED ORDER — PROMETHAZINE HCL 25 MG PO TABS: 25.0000 mg | ORAL_TABLET | Freq: Four times a day (QID) | ORAL | 0 refills | Status: DC | PRN

## 2017-07-04 MED ORDER — TAMSULOSIN HCL 0.4 MG PO CAPS: 0.4000 mg | ORAL_CAPSULE | Freq: Every day | ORAL | 0 refills | Status: AC

## 2017-07-04 MED ORDER — SODIUM CHLORIDE 0.9 % IV BOLUS (SEPSIS)
1000.0000 mL | Freq: Once | INTRAVENOUS | Status: AC
  Administered 2017-07-04: 1000 mL via INTRAVENOUS

## 2017-07-04 NOTE — ED Provider Notes (Signed)
[facility]-EMERGENCY DEPT Provider Note   CSN: [id number] Arrival date & time: 07/04/17  0248     History   Chief Complaint Chief Complaint  Patient presents with  . Flank Pain    HPI Megan Frazier is a 36 y.o. female.  36 year old female with a history of chronic back pain and kidney stones (most recently 17mm stone in [place] requiring lithotripsy and ureteral stenting) presents to the ED for evaluation of left-sided flank pain. Symptoms began a little after 2330 tonight. Patient reports constant pain which has been worsening; unrelieved by [facility] powders taken prior to arrival. She noted some proceeding hematuria at 1900 tonight. She has also had an urge to void with decreased urinary stream. Worsening pain has led to associated nausea and vomiting. She was noted to have vomiting in triage as well. Patient denies associated fever, dysuria, vaginal complaints. No hx of abdominal surgeries.      Past Medical History:  Diagnosis Date  . Chronic back pain   . DDD (degenerative disc disease), lumbar   . Hematuria   . History of kidney stones   . Lumbar spondylosis   . Nephrolithiasis    left side non-obstructive per ct 04-03-2017  . Ureteropelvic junction calculus    right side    There are no active problems to display for this patient.   Past Surgical History:  Procedure Laterality Date  . CYSTOSCOPY W/ URETERAL STENT PLACEMENT Right 02/22/2013   Procedure: CYSTOSCOPY WITH RETROGRADE PYELOGRAM/URETERAL STENT PLACEMENT;  Surgeon: Lindaann Slough, MD;  Location: [facility];  Service: Urology;  Laterality: Right;  . CYSTOSCOPY WITH RETROGRADE PYELOGRAM, URETEROSCOPY AND STENT PLACEMENT Right 05/14/2017   Procedure: CYSTOSCOPY WITH RETROGRADE PYELOGRAM, URETEROSCOPY AND STENT PLACEMENT;  Surgeon: Malen Gauze, MD;  Location: [facility];  Service: Urology;  Laterality: Right;  . CYSTOSCOPY WITH RETROGRADE PYELOGRAM, URETEROSCOPY AND STENT PLACEMENT Right 06/04/2017   Procedure: CYSTOSCOPY WITH RETROGRADE PYELOGRAM, URETEROSCOPY AND STENT REPLACEMENT;  Surgeon: Malen Gauze, MD;  Location: [facility];  Service: Urology;  Laterality: Right;  . HOLMIUM LASER APPLICATION Right 02/22/2013   Procedure: HOLMIUM LASER APPLICATION;  Surgeon: Lindaann Slough, MD;  Location: [facility];  Service: Urology;  Laterality: Right;  . HOLMIUM LASER APPLICATION Right 06/04/2017   Procedure: HOLMIUM LASER APPLICATION;  Surgeon: Malen Gauze, MD;  Location: [facility];  Service: Urology;  Laterality: Right;  . WISDOM TOOTH EXTRACTION      OB History    Gravida Para Term Preterm AB Living   2 2 2  0 0 2   SAB TAB Ectopic Multiple Live Births   0 0 0 0         Home Medications    Prior to Admission medications   Medication Sig Start Date End Date Taking? Authorizing Provider  Aspirin-Salicylamide-Caffeine (BC HEADACHE POWDER PO) Take 1 each by mouth every 6 (six) hours as needed (pain).   Yes [provider]  diazepam (VALIUM) 5 MG tablet Take 1 tablet (5 mg total) by mouth every 8 (eight) hours as needed for anxiety or muscle spasms. Patient not taking: Reported on 07/04/2017 06/04/17   Malen Gauze, MD  oxybutynin (DITROPAN) 5 MG tablet Take 1 tablet (5 mg total) by mouth every 8 (eight) hours as needed for bladder spasms. Patient not taking: Reported on 07/04/2017 05/14/17   Malen Gauze, MD  oxyCODONE-acetaminophen (PERCOCET) 10-325 MG tablet Take 1 tablet by mouth every 6 (six) hours as  needed for pain. 07/04/17   Antony Madura, PA-C  phenazopyridine (PYRIDIUM) 100 MG tablet Take 1 tablet (100 mg total) by mouth 3 (three) times daily as needed for pain. Patient not taking: Reported on 07/04/2017 06/04/17   Malen Gauze, MD  promethazine (PHENERGAN) 25 MG tablet Take 1 tablet (25 mg total) by mouth every 6 (six) hours as needed for nausea or vomiting. 07/04/17   Antony Madura, PA-C  tamsulosin (FLOMAX) 0.4 MG CAPS  capsule Take 1 capsule (0.4 mg total) by mouth daily after supper. 07/04/17   Antony Madura, PA-C    Family History Family History  Problem Relation Age of Onset  . Other Neg Hx     Social History Social History  Substance Use Topics  . Smoking status: Current Every Day Smoker    Packs/day: 1.00    Years: 16.00    Types: Cigarettes  . Smokeless tobacco: Never Used  . Alcohol use Yes     Comment: occasional     Allergies   Latex   Review of Systems Review of Systems Ten systems reviewed and are negative for acute change, except as noted in the HPI.    Physical Exam Updated Vital Signs BP (!) 131/95 (BP Location: Right Arm)   Pulse 78   Temp 98.2 F (36.8 C) (Oral)   Resp 20   SpO2 100%   Physical Exam  Constitutional: She is oriented to person, place, and time. She appears well-developed and well-nourished. No distress.  Nontoxic and nonseptic appearing  HENT:  Head: Normocephalic and atraumatic.  Eyes: Conjunctivae and EOM are normal. No scleral icterus.  Neck: Normal range of motion.  Cardiovascular: Normal rate, regular rhythm and intact distal pulses.   Pulmonary/Chest: Effort normal. No respiratory distress. She has no wheezes.  Respirations even and unlabored  Abdominal: Soft. There is tenderness (left CVA).  Soft, nondistended abdomen. No palpable masses or peritoneal signs.  Musculoskeletal: Normal range of motion.  Neurological: She is alert and oriented to person, place, and time. She exhibits normal muscle tone. Coordination normal.  GCS 15. Patient moving all extremities.  Skin: Skin is warm and dry. No rash noted. She is not diaphoretic. No erythema. No pallor.  Psychiatric: She has a normal mood and affect. Her behavior is normal.  Nursing note and vitals reviewed.    ED Treatments / Results  Labs (all labs ordered are listed, but only abnormal results are displayed) Labs Reviewed  URINALYSIS, ROUTINE W REFLEX MICROSCOPIC - Abnormal; Notable  for the following:       Result Value   Color, Urine AMBER (*)    Hgb urine dipstick LARGE (*)    Bacteria, UA RARE (*)    Squamous Epithelial / LPF 0-5 (*)    All other components within normal limits  I-STAT CHEM 8, ED - Abnormal; Notable for the following:    BUN 23 (*)    Creatinine, Ser 1.10 (*)    All other components within normal limits  PREGNANCY, URINE    EKG  EKG Interpretation None       Radiology US Renal  Result Date: 07/04/2017 CLINICAL DATA:  Acute onset of flank pain and hematuria. Initial encounter. EXAM: RENAL / URINARY TRACT ULTRASOUND COMPLETE COMPARISON:  CT of the abdomen and pelvis performed 04/03/2017 FINDINGS: Right Kidney: Length: 11.2 cm. Echogenicity within normal limits. No mass or hydronephrosis visualized. Left Kidney: Length: 11.5 cm. Echogenicity within normal limits. No mass or hydronephrosis visualized. Bladder: Appears normal for degree  of bladder distention. Bilateral ureteral jets are visualized. IMPRESSION: Unremarkable renal ultrasound.  No evidence of hydronephrosis. Electronically Signed   By: Roanna RaiderJeffery  Chang M.D.   On: 07/04/2017 05:22    Procedures Procedures (including critical care time)  Medications Ordered in ED Medications  ketorolac (TORADOL) 30 MG/ML injection 30 mg (30 mg Intravenous Given 07/04/17 0415)  ondansetron (ZOFRAN) injection 4 mg (4 mg Intravenous Given 07/04/17 0415)  HYDROmorphone (DILAUDID) injection 0.5 mg (0.5 mg Intravenous Given 07/04/17 0415)  sodium chloride 0.9 % bolus 1,000 mL (0 mLs Intravenous Stopped 07/04/17 0627)  oxyCODONE-acetaminophen (PERCOCET/ROXICET) 5-325 MG per tablet 2 tablet (2 tablets Oral Given 07/04/17 0702)     Initial Impression / Assessment and Plan / ED Course  I have reviewed the triage vital signs and the nursing notes.  Pertinent labs & imaging results that were available during my care of the patient were reviewed by me and considered in my medical decision making (see chart for  details).     Patient presented for left flank pain and hematuria. Symptoms suspicious for renal colic. Patient with known history of left-sided kidney stones on recent CT from [place]. Ultrasound shows no evidence of significant hydronephrosis. Creatinine only slightly elevated from baseline. No evidence of associated urinary tract infection. Nausea, vomiting, and pain controlled with medications given in the emergency department. Pt will be discharged home with pain medications. She has been advised to follow up with her Urologist, Dr. Ronne BinningMcKenzie. Return precautions discussed and provided. Patient discharged in stable condition with no unaddressed concerns..    Final Clinical Impressions(s) / ED Diagnoses   Final diagnoses:  Left flank pain  Hematuria, unspecified type    New Prescriptions New Prescriptions   PROMETHAZINE (PHENERGAN) 25 MG TABLET    Take 1 tablet (25 mg total) by mouth every 6 (six) hours as needed for nausea or vomiting.     Antony MaduraHumes, Lige Lakeman, PA-C 07/04/17 [id number]    Gilda CreasePollina, Christopher J, MD 07/04/17 [phone]

## 2017-07-04 NOTE — ED Notes (Signed)
Pt states that she hasn't urinated since early this am She complains of right sided flank and back pain

## 2017-07-04 NOTE — ED Notes (Signed)
Pt states he can't void at this time

## 2017-07-04 NOTE — ED Triage Notes (Signed)
Pt c/o hematuria at 7p and L sided flank pain starting at 1130p. Hx of kidney stones. Pt is vomiting in triage. A&Ox4.

## 2018-01-23 ENCOUNTER — Encounter (HOSPITAL_COMMUNITY): Payer: Self-pay

## 2018-01-23 ENCOUNTER — Emergency Department (HOSPITAL_COMMUNITY)
Admission: EM | Admit: 2018-01-23 | Discharge: 2018-01-23 | Disposition: A | Discharge status: Home / Self Care | Payer: Self-pay | Attending: Emergency Medicine | Admitting: Emergency Medicine

## 2018-01-23 ENCOUNTER — Emergency Department (HOSPITAL_COMMUNITY): Payer: Self-pay

## 2018-01-23 DIAGNOSIS — J019 Acute sinusitis, unspecified: Secondary | ICD-10-CM

## 2018-01-23 DIAGNOSIS — R059 Cough, unspecified: Secondary | ICD-10-CM

## 2018-01-23 DIAGNOSIS — F17210 Nicotine dependence, cigarettes, uncomplicated: Secondary | ICD-10-CM | POA: Insufficient documentation

## 2018-01-23 DIAGNOSIS — R05 Cough: Secondary | ICD-10-CM

## 2018-01-23 DIAGNOSIS — Z91040 Latex allergy status: Secondary | ICD-10-CM | POA: Insufficient documentation

## 2018-01-23 MED ORDER — AMOXICILLIN-POT CLAVULANATE 875-125 MG PO TABS: 1.0000 | ORAL_TABLET | Freq: Two times a day (BID) | ORAL | 0 refills | Status: DC

## 2018-01-23 MED ORDER — ALBUTEROL SULFATE HFA 108 (90 BASE) MCG/ACT IN AERS
2.0000 | INHALATION_SPRAY | RESPIRATORY_TRACT | Status: DC | PRN
  Administered 2018-01-23: 2 via RESPIRATORY_TRACT
  Filled 2018-01-23: qty 6.7

## 2018-01-23 MED ORDER — ALBUTEROL SULFATE (2.5 MG/3ML) 0.083% IN NEBU
5.0000 mg | INHALATION_SOLUTION | Freq: Once | RESPIRATORY_TRACT | Status: AC
  Administered 2018-01-23: 5 mg via RESPIRATORY_TRACT
  Filled 2018-01-23: qty 6

## 2018-01-23 NOTE — ED Provider Notes (Signed)
[facility]-EMERGENCY DEPT Provider Note   CSN: [id number] Arrival date & time: 01/23/18  0235     History   Chief Complaint Chief Complaint  Patient presents with  . URI  . Back Pain    HPI Megan Frazier is a 37 y.o. female.  Patient presents to the ED with a chief complaint of cough and sinus congestion.  She states that she started with sinus pressure about a month ago.  She states that it improved on its own, then it came back worse.  She denies fevers or chills.  She states that she has a dry itchy throat and increased sinus pressure.  She reports dry cough.  She has tried taking OTC antihistamines with some relief. She is concerned about the length of the illness.   The history is provided by the patient. No language interpreter was used.    Past Medical History:  Diagnosis Date  . Chronic back pain   . DDD (degenerative disc disease), lumbar   . Hematuria   . History of kidney stones   . Lumbar spondylosis   . Nephrolithiasis    left side non-obstructive per ct 04-03-2017  . Ureteropelvic junction calculus    right side    There are no active problems to display for this patient.   Past Surgical History:  Procedure Laterality Date  . CYSTOSCOPY W/ URETERAL STENT PLACEMENT Right 02/22/2013   Procedure: CYSTOSCOPY WITH RETROGRADE PYELOGRAM/URETERAL STENT PLACEMENT;  Surgeon: Lindaann Slough, MD;  Location: [facility];  Service: Urology;  Laterality: Right;  . CYSTOSCOPY WITH RETROGRADE PYELOGRAM, URETEROSCOPY AND STENT PLACEMENT Right 05/14/2017   Procedure: CYSTOSCOPY WITH RETROGRADE PYELOGRAM, URETEROSCOPY AND STENT PLACEMENT;  Surgeon: Malen Gauze, MD;  Location: [facility];  Service: Urology;  Laterality: Right;  . CYSTOSCOPY WITH RETROGRADE PYELOGRAM, URETEROSCOPY AND STENT PLACEMENT Right 06/04/2017   Procedure: CYSTOSCOPY WITH RETROGRADE PYELOGRAM, URETEROSCOPY AND STENT REPLACEMENT;  Surgeon: Malen Gauze, MD;   Location: [facility];  Service: Urology;  Laterality: Right;  . HOLMIUM LASER APPLICATION Right 02/22/2013   Procedure: HOLMIUM LASER APPLICATION;  Surgeon: Lindaann Slough, MD;  Location: [facility];  Service: Urology;  Laterality: Right;  . HOLMIUM LASER APPLICATION Right 06/04/2017   Procedure: HOLMIUM LASER APPLICATION;  Surgeon: Malen Gauze, MD;  Location: [facility];  Service: Urology;  Laterality: Right;  . WISDOM TOOTH EXTRACTION      OB History    Gravida Para Term Preterm AB Living   2 2 2  0 0 2   SAB TAB Ectopic Multiple Live Births   0 0 0 0         Home Medications    Prior to Admission medications   Medication Sig Start Date End Date Taking? Authorizing Provider  acetaminophen (TYLENOL) 500 MG tablet Take 1,000 mg by mouth every 6 (six) hours as needed for moderate pain.   Yes [provider]  Aspirin-Salicylamide-Caffeine (BC HEADACHE POWDER PO) Take 1 each by mouth every 6 (six) hours as needed (pain).   Yes [provider]  diazepam (VALIUM) 5 MG tablet Take 1 tablet (5 mg total) by mouth every 8 (eight) hours as needed for anxiety or muscle spasms. Patient not taking: Reported on 07/04/2017 06/04/17   Malen Gauze, MD  oxybutynin (DITROPAN) 5 MG tablet Take 1 tablet (5 mg total) by mouth every 8 (eight) hours as needed for bladder spasms. Patient not taking: Reported on 01/23/2018 05/14/17  McKenzie, Mardene CelestePatrick L, MD  oxyCODONE-acetaminophen (PERCOCET) 10-325 MG tablet Take 1 tablet by mouth every 6 (six) hours as needed for pain. Patient not taking: Reported on 01/23/2018 07/04/17   Antony MaduraHumes, Kelly, PA-C  phenazopyridine (PYRIDIUM) 100 MG tablet Take 1 tablet (100 mg total) by mouth 3 (three) times daily as needed for pain. Patient not taking: Reported on 07/04/2017 06/04/17   Malen GauzeMcKenzie, Patrick L, MD  promethazine (PHENERGAN) 25 MG tablet Take 1 tablet (25 mg total) by mouth every 6 (six) hours as needed for nausea or  vomiting. Patient not taking: Reported on 01/23/2018 07/04/17   Antony MaduraHumes, Kelly, PA-C  tamsulosin (FLOMAX) 0.4 MG CAPS capsule Take 1 capsule (0.4 mg total) by mouth daily after supper. Patient not taking: Reported on 01/23/2018 07/04/17   Antony MaduraHumes, Kelly, PA-C    Family History Family History  Problem Relation Age of Onset  . Other Neg Hx     Social History Social History   Tobacco Use  . Smoking status: Current Every Day Smoker    Packs/day: 1.00    Years: 16.00    Pack years: 16.00    Types: Cigarettes  . Smokeless tobacco: Never Used  Substance Use Topics  . Alcohol use: Yes    Comment: occasional  . Drug use: No     Allergies   Latex   Review of Systems Review of Systems  All other systems reviewed and are negative.    Physical Exam Updated Vital Signs BP (!) 133/92 (BP Location: Left Arm)   Pulse (!) 105   Temp 98.8 F (37.1 C) (Oral)   Resp 18   LMP 01/09/2018   SpO2 98%   Physical Exam  Constitutional: She is oriented to person, place, and time. She appears well-developed and well-nourished.  HENT:  Head: Normocephalic and atraumatic.  Right Ear: External ear normal.  Left Ear: External ear normal.  Mouth/Throat: Oropharynx is clear and moist. No oropharyngeal exudate.  Swollen, erythematous turbinates, maxillary sinuses tender to palpation  Eyes: Conjunctivae and EOM are normal. Pupils are equal, round, and reactive to light.  Neck: Normal range of motion. Neck supple.  Cardiovascular: Normal rate, regular rhythm and normal heart sounds. Exam reveals no gallop and no friction rub.  No murmur heard. Pulmonary/Chest: Effort normal and breath sounds normal. No respiratory distress. She has no wheezes. She has no rales. She exhibits no tenderness.  Abdominal: Soft. Bowel sounds are normal. She exhibits no distension and no mass. There is no tenderness. There is no rebound and no guarding.  Musculoskeletal: Normal range of motion. She exhibits no edema or  tenderness.  Neurological: She is alert and oriented to person, place, and time.  Skin: Skin is warm and dry.  Psychiatric: She has a normal mood and affect. Her behavior is normal. Judgment and thought content normal.  Nursing note and vitals reviewed.    ED Treatments / Results  Labs (all labs ordered are listed, but only abnormal results are displayed) Labs Reviewed - No data to display  EKG  EKG Interpretation None       Radiology Dg Chest 2 View  Result Date: 01/23/2018 CLINICAL DATA:  37 y/o  F; shortness of breath for 1 week. EXAM: CHEST  2 VIEW COMPARISON:  09/14/2010 chest radiograph FINDINGS: Stable heart size and mediastinal contours are within normal limits. Both lungs are clear. The visualized skeletal structures are unremarkable. IMPRESSION: No active cardiopulmonary disease. Electronically Signed   By: Mitzi HansenLance  Furusawa-Stratton M.D.   On: 01/23/2018  03:21    Procedures Procedures (including critical care time)  Medications Ordered in ED Medications  albuterol (PROVENTIL HFA;VENTOLIN HFA) 108 (90 Base) MCG/ACT inhaler 2 puff (not administered)  albuterol (PROVENTIL) (2.5 MG/3ML) 0.083% nebulizer solution 5 mg (5 mg Nebulization Given 01/23/18 0327)     Initial Impression / Assessment and Plan / ED Course  I have reviewed the triage vital signs and the nursing notes.  Pertinent labs & imaging results that were available during my care of the patient were reviewed by me and considered in my medical decision making (see chart for details).     Patient with sinus pressure x 1 month.  Initially improved, then came back worse.  Associated dry cough.  Improved with neb.  Will give inhaler.  VSS.   Afebrile.  Will treat with augmentin for sinusitis.   Discussed smoking cessation with patient and was they were offerred resources to help stop.  Total time was 5 min CPT code 69629.  Final Clinical Impressions(s) / ED Diagnoses   Final diagnoses:  Acute sinusitis,  recurrence not specified, unspecified location  Cough    ED Discharge Orders        Ordered    amoxicillin-clavulanate (AUGMENTIN) 875-125 MG tablet  Every 12 hours     01/23/18 0430       Roxy Horseman, PA-C 01/23/18 0431    Zadie Rhine, MD 01/23/18 [phone]

## 2018-01-23 NOTE — ED Notes (Signed)
Bed: [facility] Expected date:  Expected time:  Means of arrival:  Comments: 

## 2018-01-23 NOTE — ED Triage Notes (Signed)
Pt complains of URI since January Pt states that she's having a hard time breathing and sinus trouble

## 2018-08-08 IMAGING — CR DG CHEST 2V
2 series · 2 of 2 positions shown · non-contrast
Comparison: 09/14/2010 chest radiograph

CLINICAL DATA: 36 y/o  F; shortness of breath for 1 week.

EXAM:
CHEST  2 VIEW

[w chest pa]
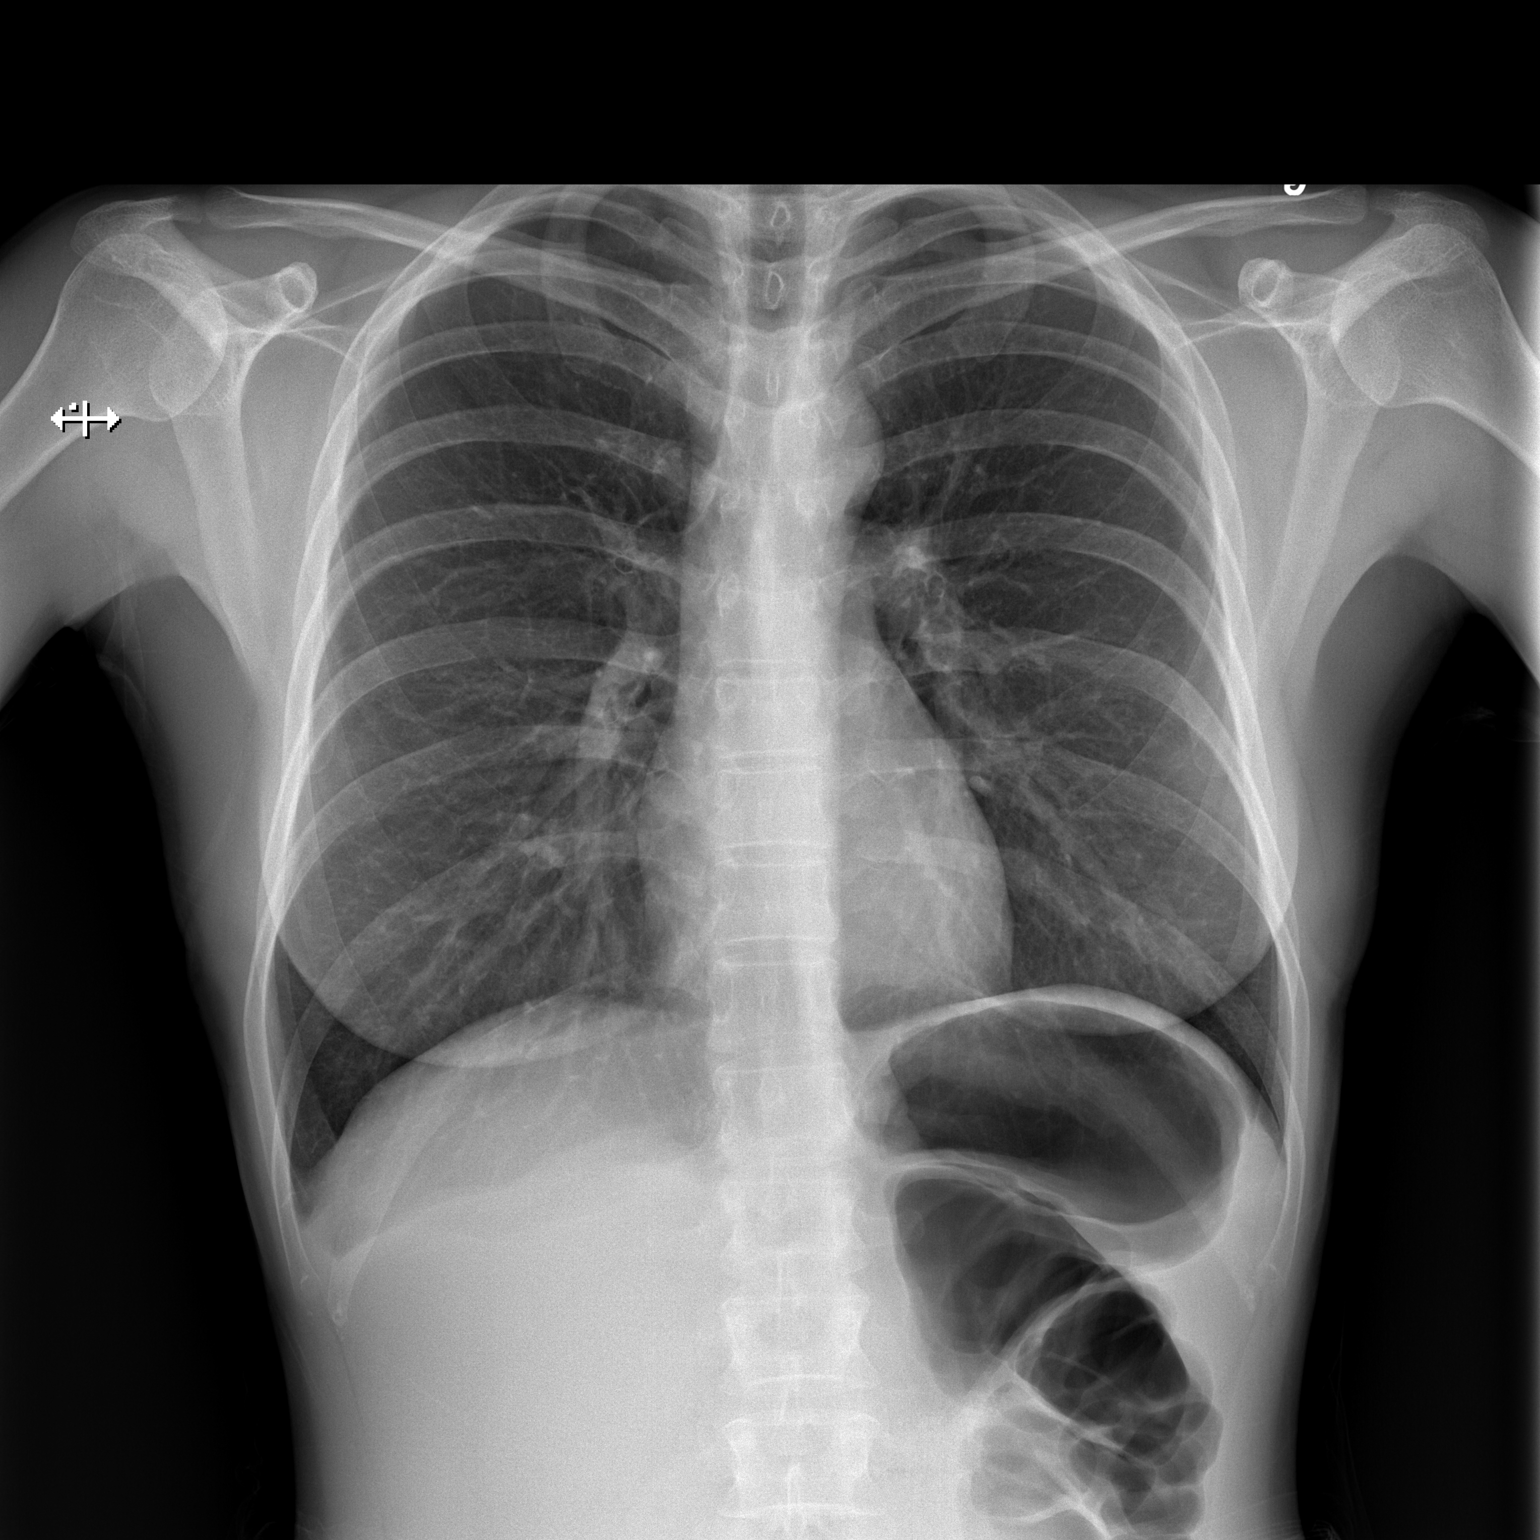

[w chest lat]
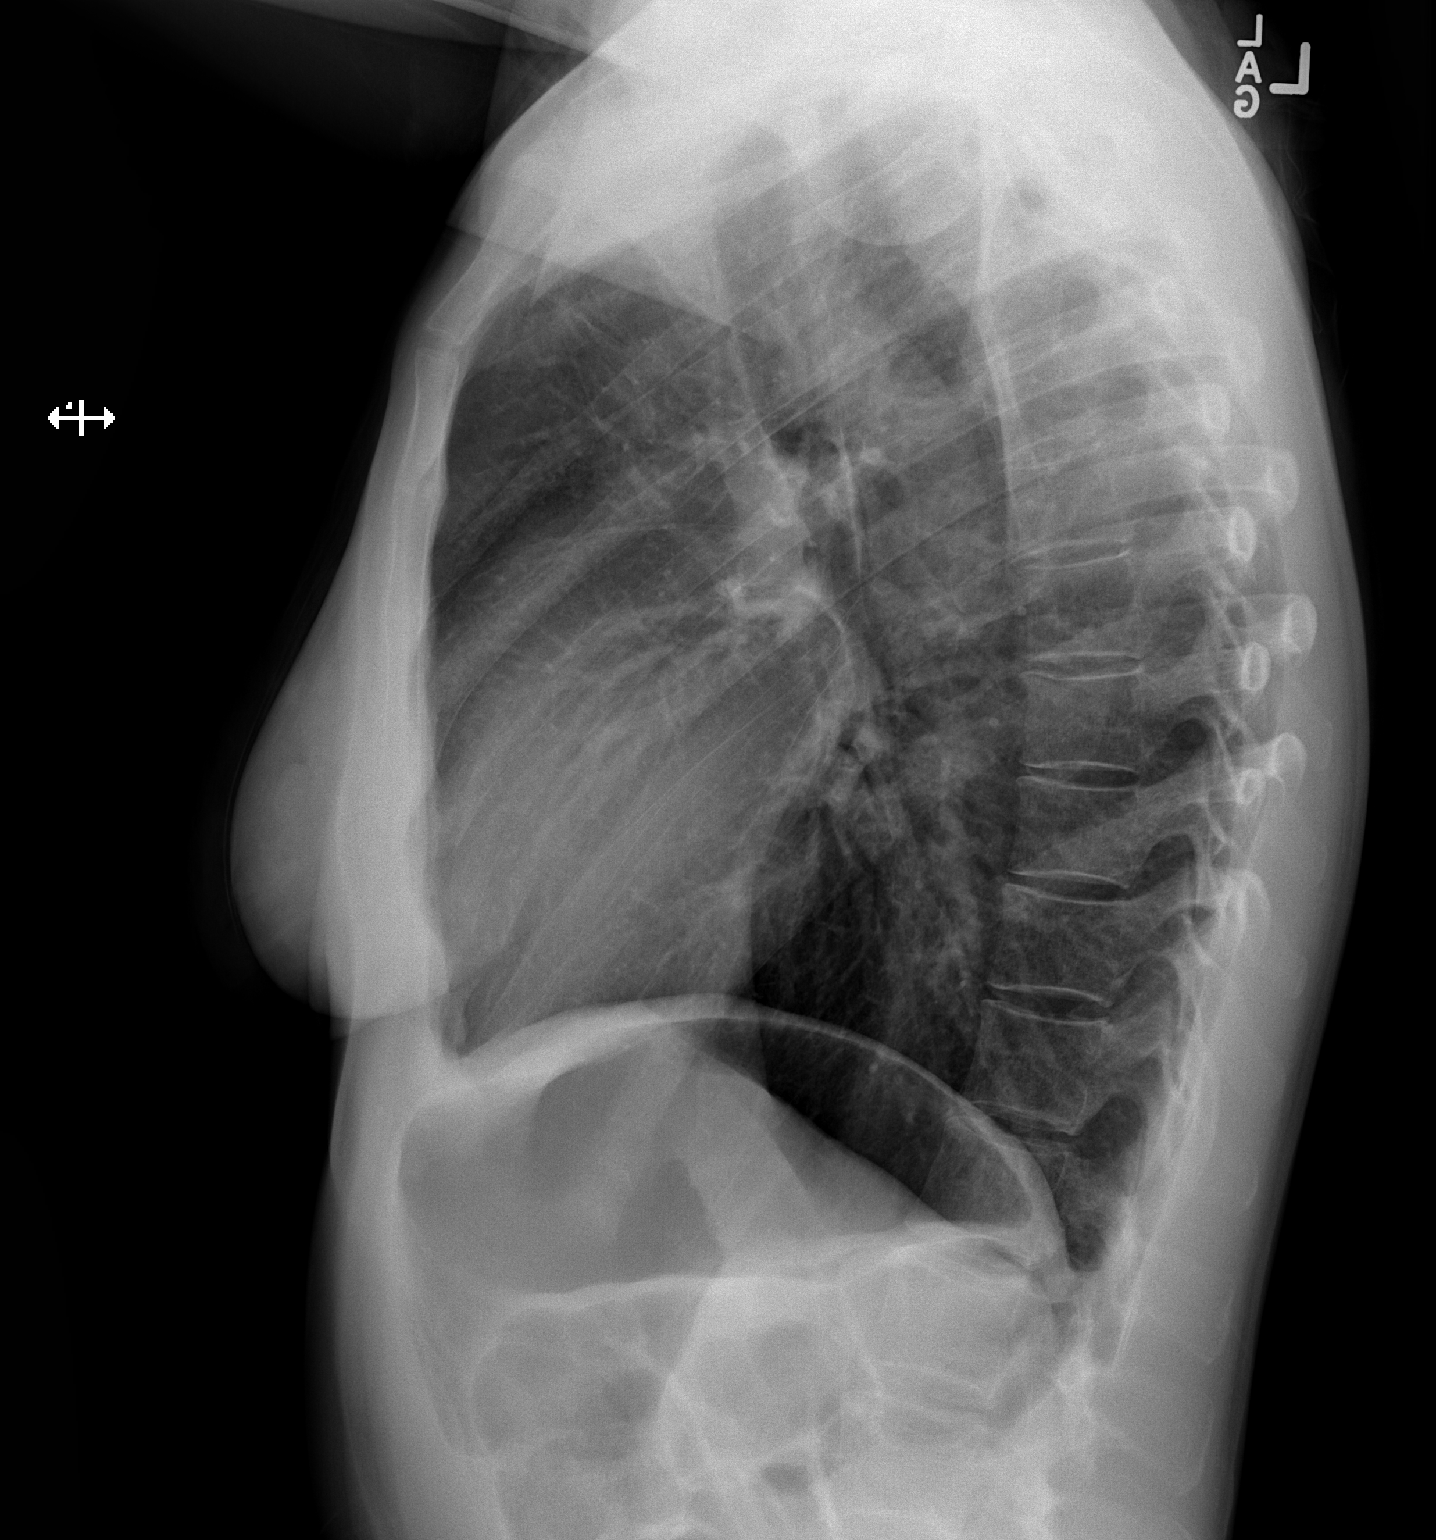

[2 of 2 positions shown; findings below may reference images not displayed]

FINDINGS: Stable heart size and mediastinal contours are within normal limits.
Both lungs are clear. The visualized skeletal structures are
unremarkable.
IMPRESSION: No active cardiopulmonary disease.

By: Jeti-Ari M. Deliu M.D.

## 2022-07-29 ENCOUNTER — Other Ambulatory Visit: Payer: Self-pay

## 2022-07-29 ENCOUNTER — Emergency Department (HOSPITAL_COMMUNITY)
Admission: EM | Admit: 2022-07-29 | Discharge: 2022-07-30 | Disposition: A | Discharge status: Home / Self Care | Payer: Self-pay | Attending: Emergency Medicine | Admitting: Emergency Medicine

## 2022-07-29 ENCOUNTER — Encounter (HOSPITAL_COMMUNITY): Payer: Self-pay | Admitting: Emergency Medicine

## 2022-07-29 ENCOUNTER — Emergency Department (HOSPITAL_COMMUNITY): Payer: Self-pay

## 2022-07-29 DIAGNOSIS — E876 Hypokalemia: Secondary | ICD-10-CM | POA: Insufficient documentation

## 2022-07-29 DIAGNOSIS — Z91040 Latex allergy status: Secondary | ICD-10-CM | POA: Insufficient documentation

## 2022-07-29 DIAGNOSIS — F5102 Adjustment insomnia: Secondary | ICD-10-CM

## 2022-07-29 DIAGNOSIS — F22 Delusional disorders: Secondary | ICD-10-CM

## 2022-07-29 DIAGNOSIS — Z20822 Contact with and (suspected) exposure to covid-19: Secondary | ICD-10-CM | POA: Insufficient documentation

## 2022-07-29 DIAGNOSIS — R443 Hallucinations, unspecified: Secondary | ICD-10-CM

## 2022-07-29 DIAGNOSIS — H538 Other visual disturbances: Secondary | ICD-10-CM | POA: Insufficient documentation

## 2022-07-29 DIAGNOSIS — G4700 Insomnia, unspecified: Secondary | ICD-10-CM | POA: Insufficient documentation

## 2022-07-29 LAB — RAPID URINE DRUG SCREEN, HOSP PERFORMED
Amphetamines: NOT DETECTED
Barbiturates: NOT DETECTED
Barbiturates: NOT DETECTED
Benzodiazepines: NOT DETECTED
Benzodiazepines: NOT DETECTED
Cocaine: NOT DETECTED
Cocaine: NOT DETECTED
Opiates: NOT DETECTED
Opiates: NOT DETECTED
Tetrahydrocannabinol: NOT DETECTED

## 2022-07-29 LAB — I-STAT CHEM 8, ED
BUN: 12 mg/dL (ref 6–20)
Calcium, Ion: 0.99 mmol/L — ABNORMAL LOW (ref 1.15–1.40)
Chloride: 116 mmol/L — ABNORMAL HIGH (ref 98–111)
Creatinine, Ser: 0.8 mg/dL (ref 0.44–1.00)
Glucose, Bld: 125 mg/dL — ABNORMAL HIGH (ref 70–99)
HCT: 33 % — ABNORMAL LOW (ref 36.0–46.0)
Hemoglobin: 11.2 g/dL — ABNORMAL LOW (ref 12.0–15.0)
Potassium: 3 mmol/L — ABNORMAL LOW (ref 3.5–5.1)
Sodium: 142 mmol/L (ref 135–145)
TCO2: 16 mmol/L — ABNORMAL LOW (ref 22–32)

## 2022-07-29 LAB — CBC
HCT: 37.1 % (ref 36.0–46.0)
Hemoglobin: 12.5 g/dL (ref 12.0–15.0)
MCH: 30.6 pg (ref 26.0–34.0)
MCHC: 33.7 g/dL (ref 30.0–36.0)
MCV: 90.7 fL (ref 80.0–100.0)
Platelets: 331 10*3/uL (ref 150–400)
RBC: 4.09 MIL/uL (ref 3.87–5.11)
RDW: 16.3 % — ABNORMAL HIGH (ref 11.5–15.5)
WBC: 4.3 10*3/uL (ref 4.0–10.5)
nRBC: 0 % (ref 0.0–0.2)

## 2022-07-29 LAB — COMPREHENSIVE METABOLIC PANEL
ALT: 29 U/L (ref 0–44)
AST: 45 U/L — ABNORMAL HIGH (ref 15–41)
Albumin: 3.5 g/dL (ref 3.5–5.0)
Alkaline Phosphatase: 65 U/L (ref 38–126)
Anion gap: 12 (ref 5–15)
BUN: 17 mg/dL (ref 6–20)
CO2: 18 mmol/L — ABNORMAL LOW (ref 22–32)
Calcium: 8.7 mg/dL — ABNORMAL LOW (ref 8.9–10.3)
Chloride: 110 mmol/L (ref 98–111)
Creatinine, Ser: 1.39 mg/dL — ABNORMAL HIGH (ref 0.44–1.00)
GFR, Estimated: 49 mL/min — ABNORMAL LOW (ref >60)
Glucose, Bld: 89 mg/dL (ref 70–99)
Potassium: 3.6 mmol/L (ref 3.5–5.1)
Sodium: 140 mmol/L (ref 135–145)
Total Bilirubin: 0.5 mg/dL (ref 0.3–1.2)
Total Protein: 6.8 g/dL (ref 6.5–8.1)

## 2022-07-29 LAB — URINALYSIS, ROUTINE W REFLEX MICROSCOPIC
Bilirubin Urine: NEGATIVE
Glucose, UA: NEGATIVE mg/dL
Ketones, ur: 80 mg/dL — AB
Leukocytes,Ua: NEGATIVE
Nitrite: NEGATIVE
Protein, ur: 30 mg/dL — AB
Specific Gravity, Urine: 1.028 (ref 1.005–1.030)
pH: 5 (ref 5.0–8.0)

## 2022-07-29 LAB — LIPASE, BLOOD: Lipase: 24 U/L (ref 11–51)

## 2022-07-29 LAB — I-STAT BETA HCG BLOOD, ED (MC, WL, AP ONLY): I-stat hCG, quantitative: <5 m[IU]/mL (ref <5)

## 2022-07-29 LAB — ETHANOL: Alcohol, Ethyl (B): <10 mg/dL (ref <10)

## 2022-07-29 LAB — TSH: TSH: 0.325 u[IU]/mL — ABNORMAL LOW (ref 0.350–4.500)

## 2022-07-29 LAB — AMMONIA: Ammonia: 21 umol/L (ref 9–35)

## 2022-07-29 MED ORDER — DROPERIDOL 2.5 MG/ML IJ SOLN
1.2500 mg | Freq: Once | INTRAMUSCULAR | Status: AC
  Administered 2022-07-29: 1.25 mg via INTRAVENOUS
  Filled 2022-07-29: qty 2

## 2022-07-29 MED ORDER — SODIUM CHLORIDE 0.9 % IV BOLUS
1000.0000 mL | Freq: Once | INTRAVENOUS | Status: AC
  Administered 2022-07-29: 1000 mL via INTRAVENOUS

## 2022-07-29 NOTE — ED Provider Notes (Signed)
[facility] EMERGENCY DEPARTMENT Provider Note   CSN: [id number] Arrival date & time: 07/29/22  1635     History {Add pertinent medical, surgical, social history, OB history to HPI:1} Chief Complaint  Patient presents with   Insomnia    Megan Frazier is a 41 y.o. female who presents with multiple complaints.  Chiefly patient states that she has not been able to sleep for the past 4 days.  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  Patient states that she knows that there is nothing coming out of her skin but she has seen things moving under her skin has been picking at it.  She denies any drug use at all and has never had any episodes of severe insomnia.  She has no known psychiatric history.  She denies alcohol or benzodiazepine use or abuse states that she has not had any dependence on any medications.  Patient states that she feels confused and has difficulty concentrating.  Her vision is blurry.  She denies a headache or chest pain.    Insomnia       Home Medications Prior to Admission medications   Medication Sig Start Date End Date Taking? Authorizing Provider  acetaminophen (TYLENOL) 500 MG tablet Take 1,000 mg by mouth every 6 (six) hours as needed for moderate pain.    [provider]  amoxicillin-clavulanate (AUGMENTIN) 875-125 MG tablet Take 1 tablet by mouth every 12 (twelve) hours. 01/23/18   Roxy Horseman, PA-C  Aspirin-Salicylamide-Caffeine (BC HEADACHE POWDER PO) Take 1 each by mouth every 6 (six) hours as needed (pain).    [provider]  diazepam (VALIUM) 5 MG tablet Take 1 tablet (5 mg total) by mouth every 8 (eight) hours as needed for anxiety or muscle spasms. Patient not taking: Reported on 07/04/2017 06/04/17   Malen Gauze, MD  oxybutynin (DITROPAN) 5 MG tablet Take 1 tablet (5 mg total) by mouth every 8 (eight) hours as needed for bladder spasms. Patient not  taking: Reported on 01/23/2018 05/14/17   Malen Gauze, MD  oxyCODONE-acetaminophen (PERCOCET) 10-325 MG tablet Take 1 tablet by mouth every 6 (six) hours as needed for pain. Patient not taking: Reported on 01/23/2018 07/04/17   Antony Madura, PA-C  phenazopyridine (PYRIDIUM) 100 MG tablet Take 1 tablet (100 mg total) by mouth 3 (three) times daily as needed for pain. Patient not taking: Reported on 07/04/2017 06/04/17   Malen Gauze, MD  promethazine (PHENERGAN) 25 MG tablet Take 1 tablet (25 mg total) by mouth every 6 (six) hours as needed for nausea or vomiting. Patient not taking: Reported on 01/23/2018 07/04/17   Antony Madura, PA-C  tamsulosin (FLOMAX) 0.4 MG CAPS capsule Take 1 capsule (0.4 mg total) by mouth daily after supper. Patient not taking: Reported on 01/23/2018 07/04/17   Antony Madura, PA-C      Allergies    Latex    Review of Systems   Review of Systems  Psychiatric/Behavioral:  The patient has insomnia.     Physical Exam Updated Vital Signs BP (!) 149/84 (BP Location: Right Arm)   Pulse 85   Temp 99.1 F (37.3 C) (Oral)   Resp 18   Ht 5' (1.524 m)   Wt 48.1 kg   LMP 07/22/2022   SpO2 99%   BMI 20.71 kg/m  Physical Exam Vitals and nursing note reviewed.  Constitutional:      General: She is not in acute distress.  Appearance: She is underweight. She is not diaphoretic.  HENT:     Head: Normocephalic and atraumatic.     Right Ear: External ear normal.     Left Ear: External ear normal.     Nose: Nose normal.     Mouth/Throat:     Mouth: Mucous membranes are moist.  Eyes:     General: No scleral icterus.    Extraocular Movements: Extraocular movements intact.     Conjunctiva/sclera: Conjunctivae normal.     Pupils: Pupils are equal, round, and reactive to light.  Cardiovascular:     Rate and Rhythm: Normal rate and regular rhythm.     Heart sounds: Normal heart sounds. No murmur heard.    No friction rub. No gallop.  Pulmonary:     Effort:  Pulmonary effort is normal. No respiratory distress.     Breath sounds: Normal breath sounds.  Abdominal:     General: Bowel sounds are normal. There is no distension.     Palpations: Abdomen is soft. There is no mass.     Tenderness: There is no abdominal tenderness. There is no guarding.  Musculoskeletal:     Cervical back: Normal range of motion.  Skin:    General: Skin is warm and dry.     Comments: Wounds on the face and arms consistent with skin picking  Neurological:     General: No focal deficit present.     Mental Status: She is alert and oriented to person, place, and time.     Cranial Nerves: No cranial nerve deficit.     Sensory: No sensory deficit.     Motor: No weakness.     Coordination: Coordination normal.     Gait: Gait normal.  Psychiatric:        Attention and Perception: Attention normal.        Mood and Affect: Mood normal.        Speech: Speech normal.        Behavior: Behavior is slowed. Behavior is cooperative.        Cognition and Memory: Memory normal.        Judgment: Judgment normal.     ED Results / Procedures / Treatments   Labs (all labs ordered are listed, but only abnormal results are displayed) Labs Reviewed  COMPREHENSIVE METABOLIC PANEL - Abnormal; Notable for the following components:      Result Value   CO2 18 (*)    Creatinine, Ser 1.39 (*)    Calcium 8.7 (*)    AST 45 (*)    GFR, Estimated 49 (*)    All other components within normal limits  CBC - Abnormal; Notable for the following components:   RDW 16.3 (*)    All other components within normal limits  URINALYSIS, ROUTINE W REFLEX MICROSCOPIC - Abnormal; Notable for the following components:   APPearance HAZY (*)    Hgb urine dipstick SMALL (*)    Ketones, ur 80 (*)    Protein, ur 30 (*)    Bacteria, UA RARE (*)    All other components within normal limits  LIPASE, BLOOD  I-STAT BETA HCG BLOOD, ED ([facility], [facility], [facility] ONLY)    EKG None  Radiology No results  found.  Procedures Procedures  {Document cardiac monitor, telemetry assessment procedure when appropriate:1}  Medications Ordered in ED Medications - No data to display  ED Course/ Medical Decision Making/ A&P  Medical Decision Making  ***  {Document critical care time when appropriate:1} {Document review of labs and clinical decision tools ie heart score, Chads2Vasc2 etc:1}  {Document your independent review of radiology images, and any outside records:1} {Document your discussion with family members, caretakers, and with consultants:1} {Document social determinants of health affecting pt's care:1} {Document your decision making why or why not admission, treatments were needed:1} Final Clinical Impression(s) / ED Diagnoses Final diagnoses:  None    Rx / DC Orders ED Discharge Orders     None

## 2022-07-29 NOTE — ED Triage Notes (Signed)
The pt reports that she has not been able to sleep for 4-5   days  and  she keeps forgetting everything  harder to think  no appetite    nausea  lmp  7 days ago

## 2022-07-29 NOTE — ED Provider Notes (Incomplete)
[facility] EMERGENCY DEPARTMENT Provider Note   CSN: [id number] Arrival date & time: 07/29/22  1635     History {Add pertinent medical, surgical, social history, OB history to HPI:1} Chief Complaint  Patient presents with  . Insomnia    Megan Frazier is a 41 y.o. female who presents with multiple complaints.  Chiefly patient states that she has not been able to sleep for the past 4 days.  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  Patient states that she knows that there is nothing coming out of her skin but she has seen things moving under her skin has been picking at it.  She denies any drug use at all and has never had any episodes of severe insomnia.  She has no known psychiatric history.  She denies alcohol or benzodiazepine use or abuse states that she has not had any dependence on any medications.  Patient states that she feels confused and has difficulty concentrating.  Her vision is blurry.  She denies a headache or chest pain.    Insomnia       Home Medications Prior to Admission medications   Medication Sig Start Date End Date Taking? Authorizing Provider  acetaminophen (TYLENOL) 500 MG tablet Take 1,000 mg by mouth every 6 (six) hours as needed for moderate pain.    [provider]  amoxicillin-clavulanate (AUGMENTIN) 875-125 MG tablet Take 1 tablet by mouth every 12 (twelve) hours. 01/23/18   Roxy Horseman, PA-C  Aspirin-Salicylamide-Caffeine (BC HEADACHE POWDER PO) Take 1 each by mouth every 6 (six) hours as needed (pain).    [provider]  diazepam (VALIUM) 5 MG tablet Take 1 tablet (5 mg total) by mouth every 8 (eight) hours as needed for anxiety or muscle spasms. Patient not taking: Reported on 07/04/2017 06/04/17   Malen Gauze, MD  oxybutynin (DITROPAN) 5 MG tablet Take 1 tablet (5 mg total) by mouth every 8 (eight) hours as needed for bladder spasms. Patient not taking:  Reported on 01/23/2018 05/14/17   Malen Gauze, MD  oxyCODONE-acetaminophen (PERCOCET) 10-325 MG tablet Take 1 tablet by mouth every 6 (six) hours as needed for pain. Patient not taking: Reported on 01/23/2018 07/04/17   Antony Madura, PA-C  phenazopyridine (PYRIDIUM) 100 MG tablet Take 1 tablet (100 mg total) by mouth 3 (three) times daily as needed for pain. Patient not taking: Reported on 07/04/2017 06/04/17   Malen Gauze, MD  promethazine (PHENERGAN) 25 MG tablet Take 1 tablet (25 mg total) by mouth every 6 (six) hours as needed for nausea or vomiting. Patient not taking: Reported on 01/23/2018 07/04/17   Antony Madura, PA-C  tamsulosin (FLOMAX) 0.4 MG CAPS capsule Take 1 capsule (0.4 mg total) by mouth daily after supper. Patient not taking: Reported on 01/23/2018 07/04/17   Antony Madura, PA-C      Allergies    Latex    Review of Systems   Review of Systems  Psychiatric/Behavioral:  The patient has insomnia.     Physical Exam Updated Vital Signs BP (!) 149/84 (BP Location: Right Arm)   Pulse 85   Temp 99.1 F (37.3 C) (Oral)   Resp 18   Ht 5' (1.524 m)   Wt 48.1 kg   LMP 07/22/2022   SpO2 99%   BMI 20.71 kg/m  Physical Exam Vitals and nursing note reviewed.  Constitutional:      General: She is not in acute distress.  Appearance: She is underweight. She is not diaphoretic.  HENT:     Head: Normocephalic and atraumatic.     Right Ear: External ear normal.     Left Ear: External ear normal.     Nose: Nose normal.     Mouth/Throat:     Mouth: Mucous membranes are moist.  Eyes:     General: No scleral icterus.    Extraocular Movements: Extraocular movements intact.     Conjunctiva/sclera: Conjunctivae normal.     Pupils: Pupils are equal, round, and reactive to light.  Cardiovascular:     Rate and Rhythm: Normal rate and regular rhythm.     Heart sounds: Normal heart sounds. No murmur heard.    No friction rub. No gallop.  Pulmonary:     Effort: Pulmonary effort  is normal. No respiratory distress.     Breath sounds: Normal breath sounds.  Abdominal:     General: Bowel sounds are normal. There is no distension.     Palpations: Abdomen is soft. There is no mass.     Tenderness: There is no abdominal tenderness. There is no guarding.  Musculoskeletal:     Cervical back: Normal range of motion.  Skin:    General: Skin is warm and dry.     Comments: Wounds on the face and arms consistent with skin picking  Neurological:     General: No focal deficit present.     Mental Status: She is alert and oriented to person, place, and time.     Cranial Nerves: No cranial nerve deficit.     Sensory: No sensory deficit.     Motor: No weakness.     Coordination: Coordination normal.     Gait: Gait normal.  Psychiatric:        Attention and Perception: Attention normal.        Mood and Affect: Mood normal.        Speech: Speech normal.        Behavior: Behavior is slowed. Behavior is cooperative.        Cognition and Memory: Memory normal.        Judgment: Judgment normal.     ED Results / Procedures / Treatments   Labs (all labs ordered are listed, but only abnormal results are displayed) Labs Reviewed  COMPREHENSIVE METABOLIC PANEL - Abnormal; Notable for the following components:      Result Value   CO2 18 (*)    Creatinine, Ser 1.39 (*)    Calcium 8.7 (*)    AST 45 (*)    GFR, Estimated 49 (*)    All other components within normal limits  CBC - Abnormal; Notable for the following components:   RDW 16.3 (*)    All other components within normal limits  URINALYSIS, ROUTINE W REFLEX MICROSCOPIC - Abnormal; Notable for the following components:   APPearance HAZY (*)    Hgb urine dipstick SMALL (*)    Ketones, ur 80 (*)    Protein, ur 30 (*)    Bacteria, UA RARE (*)    All other components within normal limits  LIPASE, BLOOD  I-STAT BETA HCG BLOOD, ED ([facility], [facility], [facility] ONLY)    EKG None  Radiology No results found.  Procedures Procedures   {Document cardiac monitor, telemetry assessment procedure when appropriate:1}  Medications Ordered in ED Medications - No data to display  ED Course/ Medical Decision Making/ A&P  Medical Decision Making Amount and/or Complexity of Data Reviewed Labs: ordered. Radiology: ordered. ECG/medicine tests: ordered.   ***  {Document critical care time when appropriate:1} {Document review of labs and clinical decision tools ie heart score, Chads2Vasc2 etc:1}  {Document your independent review of radiology images, and any outside records:1} {Document your discussion with family members, caretakers, and with consultants:1} {Document social determinants of health affecting pt's care:1} {Document your decision making why or why not admission, treatments were needed:1} Final Clinical Impression(s) / ED Diagnoses Final diagnoses:  None    Rx / DC Orders ED Discharge Orders     None

## 2022-07-29 NOTE — ED Notes (Signed)
Patient ambulatory with steady gait to restroom

## 2022-07-30 DIAGNOSIS — R443 Hallucinations, unspecified: Secondary | ICD-10-CM

## 2022-07-30 DIAGNOSIS — F5102 Adjustment insomnia: Secondary | ICD-10-CM

## 2022-07-30 LAB — RESP PANEL BY RT-PCR (FLU A&B, COVID) ARPGX2
Influenza A by PCR: NEGATIVE
Influenza B by PCR: NEGATIVE
SARS Coronavirus 2 by RT PCR: NEGATIVE

## 2022-07-30 LAB — T4, FREE: Free T4: 0.58 ng/dL — ABNORMAL LOW (ref 0.61–1.12)

## 2022-07-30 NOTE — ED Notes (Signed)
Patient's father, Greer Pickerel [place]aux waiting in lobby; provided him with patient update. Patient's father stated he will call/or come back to ED during visitation hours.

## 2022-07-30 NOTE — ED Notes (Signed)
Patient woken to eat lunch prior to discharge

## 2022-07-30 NOTE — Discharge Instructions (Addendum)
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
## 2022-07-30 NOTE — ED Notes (Signed)
Patient given discharge instructions, all questions answered. Patient in possession of all belongings, directed to the discharge area  

## 2022-07-30 NOTE — ED Notes (Signed)
Patient's belonging (1 bag and 1 purse) placed in locker #1.

## 2022-07-30 NOTE — ED Provider Notes (Signed)
Emergency Medicine Observation Re-evaluation Note  Megan Frazier is a 41 y.o. female, seen on rounds today.  Pt initially presented to the ED for complaints of Insomnia Currently, the patient is resting.  Physical Exam  BP 102/62 (BP Location: Left Arm)   Pulse (!) 55   Temp 97.9 F (36.6 C) (Oral)   Resp 16   Ht 5' (1.524 m)   Wt 48.1 kg   LMP 07/22/2022   SpO2 97%   BMI 20.71 kg/m  Physical Exam General: NAD Cardiac: Well perfused Lungs: even and unlabored Psych: No agitation  ED Course / MDM  EKG:EKG Interpretation  Date/Time:  Saturday July 29 2022 19:20:32 EDT Ventricular Rate:  73 PR Interval:  139 QRS Duration: 96 QT Interval:  386 QTC Calculation: 426 R Axis:   68 Text Interpretation: Sinus rhythm Confirmed by Vonita Moss [id number]) on 07/29/2022 10:23:12 PM  I have reviewed the labs performed to date as well as medications administered while in observation.  Recent changes in the last 24 hours include Pt was evaluated by Eligha Bridegroom, NP and was psychiatrically cleared.   Per consult note: Patient case reviewed and discussed with Dr. Gasper Sells.  Patient is able to contract for safety at this time, she feels appropriate and well to return home.  She is denying any hallucinations at this time.  She endorsed sleeping very well last night.  She expresses verbal understanding to return to the hospital if she experiences hallucinations or lack of sleep again.  Will psychiatrically clear patient at this time.  EDP, RN, and LCSW notified of disposition.    Plan  Current plan is for DC.    Ernie Avena, MD 07/30/22 1241

## 2022-07-31 LAB — T3, FREE: T3, Free: 1.6 pg/mL — ABNORMAL LOW (ref 2.0–4.4)

## 2023-12-30 ENCOUNTER — Emergency Department (HOSPITAL_COMMUNITY): Payer: Medicaid Other

## 2023-12-30 ENCOUNTER — Emergency Department (HOSPITAL_COMMUNITY)
Admission: EM | Admit: 2023-12-30 | Discharge: 2023-12-31 | Disposition: A | Discharge status: Home / Self Care | Payer: Medicaid Other | Attending: Emergency Medicine | Admitting: Emergency Medicine

## 2023-12-30 ENCOUNTER — Encounter (HOSPITAL_COMMUNITY): Payer: Self-pay | Admitting: *Deleted

## 2023-12-30 ENCOUNTER — Other Ambulatory Visit: Payer: Self-pay

## 2023-12-30 DIAGNOSIS — R112 Nausea with vomiting, unspecified: Secondary | ICD-10-CM | POA: Insufficient documentation

## 2023-12-30 DIAGNOSIS — R1033 Periumbilical pain: Secondary | ICD-10-CM | POA: Diagnosis not present

## 2023-12-30 DIAGNOSIS — R109 Unspecified abdominal pain: Secondary | ICD-10-CM

## 2023-12-30 LAB — CBC
HCT: 38.7 % (ref 36.0–46.0)
Hemoglobin: 12.7 g/dL (ref 12.0–15.0)
MCH: 30.7 pg (ref 26.0–34.0)
MCHC: 32.8 g/dL (ref 30.0–36.0)
MCV: 93.5 fL (ref 80.0–100.0)
Platelets: 294 10*3/uL (ref 150–400)
RBC: 4.14 MIL/uL (ref 3.87–5.11)
RDW: 13.2 % (ref 11.5–15.5)
WBC: 5.5 10*3/uL (ref 4.0–10.5)
nRBC: 0 % (ref 0.0–0.2)

## 2023-12-30 LAB — URINALYSIS, ROUTINE W REFLEX MICROSCOPIC
Bilirubin Urine: NEGATIVE
Glucose, UA: NEGATIVE mg/dL
Hgb urine dipstick: NEGATIVE
Ketones, ur: NEGATIVE mg/dL
Leukocytes,Ua: NEGATIVE
Nitrite: NEGATIVE
Protein, ur: 30 mg/dL — AB
Specific Gravity, Urine: 1.031 — ABNORMAL HIGH (ref 1.005–1.030)
pH: 5 (ref 5.0–8.0)

## 2023-12-30 LAB — COMPREHENSIVE METABOLIC PANEL
ALT: 12 U/L (ref 0–44)
AST: 18 U/L (ref 15–41)
Albumin: 3.5 g/dL (ref 3.5–5.0)
Alkaline Phosphatase: 57 U/L (ref 38–126)
Anion gap: 11 (ref 5–15)
BUN: 16 mg/dL (ref 6–20)
CO2: 23 mmol/L (ref 22–32)
Calcium: 9.2 mg/dL (ref 8.9–10.3)
Chloride: 104 mmol/L (ref 98–111)
Creatinine, Ser: 0.85 mg/dL (ref 0.44–1.00)
GFR, Estimated: >60 mL/min (ref >60)
Glucose, Bld: 103 mg/dL — ABNORMAL HIGH (ref 70–99)
Potassium: 3.8 mmol/L (ref 3.5–5.1)
Sodium: 138 mmol/L (ref 135–145)
Total Bilirubin: 0.2 mg/dL (ref 0.0–1.2)
Total Protein: 6.6 g/dL (ref 6.5–8.1)

## 2023-12-30 LAB — LIPASE, BLOOD: Lipase: 29 U/L (ref 11–51)

## 2023-12-30 LAB — HCG, SERUM, QUALITATIVE: Preg, Serum: NEGATIVE

## 2023-12-30 MED ORDER — FAMOTIDINE IN NACL 20-0.9 MG/50ML-% IV SOLN
20.0000 mg | Freq: Once | INTRAVENOUS | Status: AC
  Administered 2023-12-30: 20 mg via INTRAVENOUS
  Filled 2023-12-30: qty 50

## 2023-12-30 MED ORDER — ALUM & MAG HYDROXIDE-SIMETH 200-200-20 MG/5ML PO SUSP
30.0000 mL | Freq: Once | ORAL | Status: AC
  Administered 2023-12-30: 30 mL via ORAL
  Filled 2023-12-30: qty 30

## 2023-12-30 MED ORDER — PANTOPRAZOLE SODIUM 40 MG IV SOLR
40.0000 mg | Freq: Once | INTRAVENOUS | Status: AC
  Administered 2023-12-30: 40 mg via INTRAVENOUS
  Filled 2023-12-30: qty 10

## 2023-12-30 NOTE — ED Triage Notes (Signed)
The pt has had abd pain for 2 weeks with n a and diarrhea  lmp  3 weeks ago

## 2023-12-31 MED ORDER — IOHEXOL 350 MG/ML SOLN
75.0000 mL | Freq: Once | INTRAVENOUS | Status: AC | PRN
  Administered 2023-12-31: 75 mL via INTRAVENOUS

## 2023-12-31 MED ORDER — ONDANSETRON HCL 4 MG PO TABS: 4.0000 mg | ORAL_TABLET | Freq: Three times a day (TID) | ORAL | 0 refills | Status: AC | PRN

## 2023-12-31 MED ORDER — FAMOTIDINE 20 MG PO TABS: 20.0000 mg | ORAL_TABLET | Freq: Two times a day (BID) | ORAL | 0 refills | Status: AC

## 2023-12-31 NOTE — ED Provider Notes (Signed)
[facility] AT [facility] Provider Note   CSN: [id number] Arrival date & time: 12/30/23  1945     History  Chief Complaint  Patient presents with   Abdominal Pain    Megan Frazier is a 43 y.o. female.  Patient presents to the emergency department complaining of 2 weeks of generalized abdominal pain with episodes of nausea, vomiting, and diarrhea.  She states that she works from 3 PM to 10 PM daily.  She typically eats frozen meals/TV dinners from work.  She states that once getting home she is typically having abdominal pain just after going to sleep.  Occasionally pain is relieved by motion changes.  She frequently vomits 1 time and then feels mildly better after vomiting.  She also endorses frequent episodes of diarrhea over the same time.  She denies urinary symptoms, vaginal discharge, chest pain, shortness of breath, fever.  Past medical history significant for chronic back pain, degenerative disc disease, history of nephrolithiasis   Abdominal Pain      Home Medications Prior to Admission medications   Medication Sig Start Date End Date Taking? Authorizing Provider  famotidine (PEPCID) 20 MG tablet Take 1 tablet (20 mg total) by mouth 2 (two) times daily. 12/31/23  Yes Barrie Dunker B, PA-C  ondansetron (ZOFRAN) 4 MG tablet Take 1 tablet (4 mg total) by mouth every 8 (eight) hours as needed for nausea or vomiting. 12/31/23  Yes Darrick Grinder, PA-C  acetaminophen (TYLENOL) 500 MG tablet Take 1,000 mg by mouth as needed for moderate pain.    [provider]  Aspirin-Salicylamide-Caffeine (BC HEADACHE POWDER PO) Take 1 packet by mouth as needed (pain).    [provider]  Calcium Carbonate Antacid (TUMS PO) Take 1 tablet by mouth as needed (reflux).    [provider]  ibuprofen (ADVIL) 200 MG tablet Take 400-600 mg by mouth as needed for moderate pain.    [provider]  tamsulosin (FLOMAX) 0.4 MG CAPS capsule  Take 1 capsule (0.4 mg total) by mouth daily after supper. Patient not taking: Reported on 01/23/2018 07/04/17   Antony Madura, PA-C      Allergies    Latex    Review of Systems   Review of Systems  Gastrointestinal:  Positive for abdominal pain.    Physical Exam Updated Vital Signs BP 125/89   Pulse 68   Temp 98.2 F (36.8 C)   Resp 18   Ht 5' (1.524 m)   Wt 48.1 kg   LMP 11/29/2023   SpO2 100%   BMI 20.71 kg/m  Physical Exam Vitals and nursing note reviewed.  Constitutional:      General: She is not in acute distress.    Appearance: She is well-developed.  HENT:     Head: Normocephalic and atraumatic.  Eyes:     Conjunctiva/sclera: Conjunctivae normal.  Cardiovascular:     Rate and Rhythm: Normal rate and regular rhythm.  Pulmonary:     Effort: Pulmonary effort is normal. No respiratory distress.     Breath sounds: Normal breath sounds.  Abdominal:     General: There is no distension.     Palpations: Abdomen is soft.     Tenderness: There is abdominal tenderness in the periumbilical area. There is no right CVA tenderness or left CVA tenderness.  Musculoskeletal:        General: No swelling.     Cervical back: Neck supple.  Skin:    General: Skin is  warm and dry.     Capillary Refill: Capillary refill takes less than 2 seconds.  Neurological:     Mental Status: She is alert.  Psychiatric:        Mood and Affect: Mood normal.     ED Results / Procedures / Treatments   Labs (all labs ordered are listed, but only abnormal results are displayed) Labs Reviewed  COMPREHENSIVE METABOLIC PANEL - Abnormal; Notable for the following components:      Result Value   Glucose, Bld 103 (*)    All other components within normal limits  URINALYSIS, ROUTINE W REFLEX MICROSCOPIC - Abnormal; Notable for the following components:   APPearance HAZY (*)    Specific Gravity, Urine 1.031 (*)    Protein, ur 30 (*)    Bacteria, UA RARE (*)    All other components within normal  limits  LIPASE, BLOOD  CBC  HCG, SERUM, QUALITATIVE    EKG None  Radiology CT ABDOMEN PELVIS W CONTRAST Result Date: 12/31/2023 CLINICAL DATA:  Acute nonlocalized abdominal pain. EXAM: CT ABDOMEN AND PELVIS WITH CONTRAST TECHNIQUE: Multidetector CT imaging of the abdomen and pelvis was performed using the standard protocol following bolus administration of intravenous contrast. RADIATION DOSE REDUCTION: This exam was performed according to the departmental dose-optimization program which includes automated exposure control, adjustment of the mA and/or kV according to patient size and/or use of iterative reconstruction technique. CONTRAST:  75mL OMNIPAQUE IOHEXOL 350 MG/ML SOLN COMPARISON:  04/03/2017 FINDINGS: Lower chest: Mild dependent atelectasis in the lung bases. Hepatobiliary: No focal liver abnormality is seen. No gallstones, gallbladder wall thickening, or biliary dilatation. Pancreas: Unremarkable. No pancreatic ductal dilatation or surrounding inflammatory changes. Spleen: Normal in size without focal abnormality. Adrenals/Urinary Tract: Adrenal glands are unremarkable. Kidneys are normal, without renal calculi, focal lesion, or hydronephrosis. Bladder is unremarkable. Stomach/Bowel: Stomach, small bowel, and colon are not abnormally distended. Under distention limits evaluation but there appears to be some small bowel wall thickening which Riedesel indicate enteritis. Scattered stool throughout the colon. Appendix is not identified. Vascular/Lymphatic: Aortic atherosclerosis. No enlarged abdominal or pelvic lymph nodes. Reproductive: Uterus and ovaries are not enlarged. Other: No free air or free fluid in the abdomen. Abdominal wall musculature appears intact. Musculoskeletal: Spondylolysis with mild spondylolisthesis at L5-S1. IMPRESSION: 1. Suggestion of mild wall thickening in the small bowel possibly indicating enteritis. No evidence of obstruction. 2. Aortic atherosclerosis. 3. Spondylolysis  with mild spondylolisthesis at L5-S1. Electronically Signed   By: Burman Nieves M.D.   On: 12/31/2023 00:13    Procedures Procedures    Medications Ordered in ED Medications  famotidine (PEPCID) IVPB 20 mg premix (0 mg Intravenous Stopped 12/31/23 0003)  pantoprazole (PROTONIX) injection 40 mg (40 mg Intravenous Given 12/30/23 2327)  alum & mag hydroxide-simeth (MAALOX/MYLANTA) 200-200-20 MG/5ML suspension 30 mL (30 mLs Oral Given 12/30/23 2327)  iohexol (OMNIPAQUE) 350 MG/ML injection 75 mL (75 mLs Intravenous Contrast Given 12/31/23 0002)    ED Course/ Medical Decision Making/ A&P                                 Medical Decision Making Amount and/or Complexity of Data Reviewed Radiology: ordered.  Risk OTC drugs. Prescription drug management.   This patient presents to the ED for concern of abdominal pain, this involves an extensive number of treatment options, and is a complaint that carries with it a high risk of complications and morbidity.  The differential diagnosis includes cholecystitis, appendicitis, gastroenteritis, gastritis, enteritis, colitis, diverticulitis, others   Co morbidities that complicate the patient evaluation  Chronic back pain   Lab Tests:  I Ordered, and personally interpreted labs.  The pertinent results include: UA with rare bacteria, negative pregnancy test, unremarkable lipase, unremarkable CBC, grossly unremarkable CMP   Imaging Studies ordered:  I ordered imaging studies including CT abdomen pelvis with contrast I independently visualized and interpreted imaging which showed  1. Suggestion of mild wall thickening in the small bowel possibly  indicating enteritis. No evidence of obstruction.  2. Aortic atherosclerosis.  3. Spondylolysis with mild spondylolisthesis at L5-S1   I agree with the radiologist interpretation   Problem List / ED Course / Critical interventions / Medication management   I ordered medication including  Protonix, Pepcid, GI cocktail for abdominal pain Reevaluation of the patient after these medicines showed that the patient improved I have reviewed the patients home medicines and have made adjustments as needed   Social Determinants of Health:  Patient is a daily tobacco smoker   Test / Admission - Considered:  Patient able to tolerate oral intake.  Findings on CT concerning for possible mild enteritis.  Plan to discharge home with prescription for Zofran and recommendations for anti-inflammatories and hydration.  Patient is stable for discharge home with no indication for admission.  Return precautions provided.         Final Clinical Impression(s) / ED Diagnoses Final diagnoses:  Abdominal pain, unspecified abdominal location  Nausea vomiting and diarrhea    Rx / DC Orders ED Discharge Orders          Ordered    famotidine (PEPCID) 20 MG tablet  2 times daily        12/31/23 0052    ondansetron (ZOFRAN) 4 MG tablet  Every 8 hours PRN        12/31/23 0052              Darrick Grinder, PA-C 12/31/23 0052    Gloris Manchester, MD 12/31/23 [phone]

## 2023-12-31 NOTE — Discharge Instructions (Addendum)
Your workup was consistent with enteritis, or inflammation of the small intestine.  This can cause nausea, vomiting, abdominal pain, and diarrhea.  Is important that you drink plenty of fluids such as water and electrolyte solutions.  Avoid spicy, fatty, dairy products, foods aggravate symptoms.  You Whipple take anti-inflammatories as needed.  I have prescribed Zofran, a nausea medication to help with symptom control. I also prescribed pepcid, an antiacid.    If you develop any life-threatening symptoms please return to the emergency department.
